# Patient Record
Sex: Female | Born: 1945
Health system: Southern US, Community
[De-identification: ages and names within clinical notes are randomized; demographics above are authoritative.]

## PROBLEM LIST (undated history)

## (undated) DIAGNOSIS — N3281 Overactive bladder: Secondary | ICD-10-CM

## (undated) DIAGNOSIS — K449 Diaphragmatic hernia without obstruction or gangrene: Secondary | ICD-10-CM

## (undated) DIAGNOSIS — I10 Essential (primary) hypertension: Secondary | ICD-10-CM

## (undated) DIAGNOSIS — Z9889 Other specified postprocedural states: Secondary | ICD-10-CM

## (undated) DIAGNOSIS — M797 Fibromyalgia: Secondary | ICD-10-CM

## (undated) DIAGNOSIS — K219 Gastro-esophageal reflux disease without esophagitis: Secondary | ICD-10-CM

## (undated) DIAGNOSIS — E7800 Pure hypercholesterolemia, unspecified: Secondary | ICD-10-CM

## (undated) DIAGNOSIS — M2550 Pain in unspecified joint: Secondary | ICD-10-CM

## (undated) DIAGNOSIS — R112 Nausea with vomiting, unspecified: Secondary | ICD-10-CM

## (undated) DIAGNOSIS — J189 Pneumonia, unspecified organism: Secondary | ICD-10-CM

## (undated) DIAGNOSIS — M1990 Unspecified osteoarthritis, unspecified site: Secondary | ICD-10-CM

## (undated) DIAGNOSIS — M549 Dorsalgia, unspecified: Secondary | ICD-10-CM

## (undated) HISTORY — DX: Unspecified osteoarthritis, unspecified site: M19.90

## (undated) HISTORY — DX: Overactive bladder: N32.81

## (undated) HISTORY — DX: Diaphragmatic hernia without obstruction or gangrene: K44.9

## (undated) HISTORY — PX: APPENDECTOMY: SHX54

## (undated) HISTORY — DX: Dorsalgia, unspecified: M54.9

## (undated) HISTORY — PX: INNER EAR SURGERY: SHX679

## (undated) HISTORY — PX: OTHER SURGICAL HISTORY: SHX169

## (undated) HISTORY — DX: Pure hypercholesterolemia, unspecified: E78.00

## (undated) HISTORY — DX: Fibromyalgia: M79.7

## (undated) HISTORY — PX: ABDOMINAL HYSTERECTOMY: SHX81

## (undated) HISTORY — DX: Pain in unspecified joint: M25.50

---

## 1998-01-09 ENCOUNTER — Other Ambulatory Visit: Admission: RE | Admit: 1998-01-09 | Discharge: 1998-01-09 | Payer: Self-pay | Admitting: *Deleted

## 1998-05-21 ENCOUNTER — Ambulatory Visit (HOSPITAL_COMMUNITY): Admission: RE | Admit: 1998-05-21 | Discharge: 1998-05-21 | Payer: Self-pay | Admitting: Internal Medicine

## 1999-04-14 ENCOUNTER — Other Ambulatory Visit: Admission: RE | Admit: 1999-04-14 | Discharge: 1999-04-14 | Payer: Self-pay | Admitting: *Deleted

## 2000-04-19 ENCOUNTER — Encounter: Payer: Self-pay | Admitting: Obstetrics and Gynecology

## 2000-04-19 ENCOUNTER — Encounter: Admission: RE | Admit: 2000-04-19 | Discharge: 2000-04-19 | Payer: Self-pay | Admitting: Obstetrics and Gynecology

## 2000-08-12 ENCOUNTER — Other Ambulatory Visit: Admission: RE | Admit: 2000-08-12 | Discharge: 2000-08-12 | Payer: Self-pay | Admitting: Obstetrics and Gynecology

## 2000-09-20 ENCOUNTER — Ambulatory Visit (HOSPITAL_COMMUNITY): Admission: RE | Admit: 2000-09-20 | Discharge: 2000-09-20 | Payer: Self-pay | Admitting: Gastroenterology

## 2000-09-20 ENCOUNTER — Encounter (INDEPENDENT_AMBULATORY_CARE_PROVIDER_SITE_OTHER): Payer: Self-pay | Admitting: *Deleted

## 2000-11-15 ENCOUNTER — Encounter: Admission: RE | Admit: 2000-11-15 | Discharge: 2000-11-15 | Payer: Self-pay | Admitting: Gastroenterology

## 2000-11-15 ENCOUNTER — Encounter: Payer: Self-pay | Admitting: Gastroenterology

## 2000-11-15 ENCOUNTER — Encounter (INDEPENDENT_AMBULATORY_CARE_PROVIDER_SITE_OTHER): Payer: Self-pay | Admitting: *Deleted

## 2001-10-07 ENCOUNTER — Encounter: Payer: Self-pay | Admitting: Obstetrics and Gynecology

## 2001-10-07 ENCOUNTER — Encounter: Admission: RE | Admit: 2001-10-07 | Discharge: 2001-10-07 | Payer: Self-pay | Admitting: Obstetrics and Gynecology

## 2002-07-27 ENCOUNTER — Encounter: Admission: RE | Admit: 2002-07-27 | Discharge: 2002-07-27 | Payer: Self-pay | Admitting: Obstetrics and Gynecology

## 2002-07-27 ENCOUNTER — Encounter: Payer: Self-pay | Admitting: Obstetrics and Gynecology

## 2002-10-20 ENCOUNTER — Encounter: Admission: RE | Admit: 2002-10-20 | Discharge: 2002-10-20 | Payer: Self-pay | Admitting: Obstetrics and Gynecology

## 2002-10-20 ENCOUNTER — Encounter: Payer: Self-pay | Admitting: Obstetrics and Gynecology

## 2003-05-10 ENCOUNTER — Encounter: Admission: RE | Admit: 2003-05-10 | Discharge: 2003-05-10 | Payer: Self-pay | Admitting: Cardiology

## 2003-05-30 ENCOUNTER — Encounter: Admission: RE | Admit: 2003-05-30 | Discharge: 2003-05-30 | Payer: Self-pay | Admitting: Cardiology

## 2004-01-28 ENCOUNTER — Ambulatory Visit (HOSPITAL_COMMUNITY): Admission: RE | Admit: 2004-01-28 | Discharge: 2004-01-28 | Payer: Self-pay | Admitting: Family Medicine

## 2004-06-13 ENCOUNTER — Encounter: Admission: RE | Admit: 2004-06-13 | Discharge: 2004-06-13 | Payer: Self-pay | Admitting: Obstetrics and Gynecology

## 2005-03-25 ENCOUNTER — Ambulatory Visit: Payer: Self-pay | Admitting: Pulmonary Disease

## 2005-05-05 ENCOUNTER — Ambulatory Visit: Payer: Self-pay | Admitting: Internal Medicine

## 2005-05-08 ENCOUNTER — Ambulatory Visit: Payer: Self-pay | Admitting: Internal Medicine

## 2005-05-15 ENCOUNTER — Ambulatory Visit: Payer: Self-pay | Admitting: Cardiology

## 2005-05-21 ENCOUNTER — Ambulatory Visit: Payer: Self-pay | Admitting: Internal Medicine

## 2005-05-26 ENCOUNTER — Ambulatory Visit (HOSPITAL_COMMUNITY): Admission: RE | Admit: 2005-05-26 | Discharge: 2005-05-26 | Payer: Self-pay | Admitting: Internal Medicine

## 2005-06-18 ENCOUNTER — Encounter: Payer: Self-pay | Admitting: Gastroenterology

## 2005-07-29 ENCOUNTER — Encounter: Admission: RE | Admit: 2005-07-29 | Discharge: 2005-07-29 | Payer: Self-pay | Admitting: Obstetrics and Gynecology

## 2005-08-24 ENCOUNTER — Ambulatory Visit: Payer: Self-pay | Admitting: Internal Medicine

## 2006-08-13 ENCOUNTER — Encounter: Admission: RE | Admit: 2006-08-13 | Discharge: 2006-08-13 | Payer: Self-pay | Admitting: Obstetrics and Gynecology

## 2007-08-19 ENCOUNTER — Encounter: Admission: RE | Admit: 2007-08-19 | Discharge: 2007-08-19 | Payer: Self-pay | Admitting: Obstetrics and Gynecology

## 2009-02-01 ENCOUNTER — Encounter: Admission: RE | Admit: 2009-02-01 | Discharge: 2009-02-01 | Payer: Self-pay | Admitting: Obstetrics and Gynecology

## 2009-04-22 ENCOUNTER — Encounter (INDEPENDENT_AMBULATORY_CARE_PROVIDER_SITE_OTHER): Payer: Self-pay | Admitting: *Deleted

## 2009-05-27 ENCOUNTER — Ambulatory Visit: Payer: Self-pay | Admitting: Gastroenterology

## 2009-05-27 ENCOUNTER — Encounter (INDEPENDENT_AMBULATORY_CARE_PROVIDER_SITE_OTHER): Payer: Self-pay | Admitting: *Deleted

## 2009-05-27 DIAGNOSIS — Z860100 Personal history of colon polyps, unspecified: Secondary | ICD-10-CM | POA: Insufficient documentation

## 2009-05-27 DIAGNOSIS — K5900 Constipation, unspecified: Secondary | ICD-10-CM | POA: Insufficient documentation

## 2009-05-28 ENCOUNTER — Telehealth: Payer: Self-pay | Admitting: Gastroenterology

## 2009-06-20 ENCOUNTER — Ambulatory Visit: Payer: Self-pay | Admitting: Gastroenterology

## 2009-06-20 ENCOUNTER — Ambulatory Visit (HOSPITAL_COMMUNITY): Admission: RE | Admit: 2009-06-20 | Discharge: 2009-06-20 | Payer: Self-pay | Admitting: Gastroenterology

## 2009-06-24 ENCOUNTER — Encounter: Payer: Self-pay | Admitting: Gastroenterology

## 2010-01-07 ENCOUNTER — Telehealth (INDEPENDENT_AMBULATORY_CARE_PROVIDER_SITE_OTHER): Payer: Self-pay | Admitting: *Deleted

## 2010-03-28 ENCOUNTER — Encounter
Admission: RE | Admit: 2010-03-28 | Discharge: 2010-03-28 | Payer: Self-pay | Source: Home / Self Care | Attending: Obstetrics and Gynecology | Admitting: Obstetrics and Gynecology

## 2010-04-08 NOTE — Assessment & Plan Note (Signed)
Summary: consultation--ch.   History of Present Illness Visit Type: Initial Visit Primary GI MD: Sheryn Bison MD FACP FAGA Primary Provider: Elias Else, MD Chief Complaint: Consultation : Polyp removed in prevoius colon precancerous History of Present Illness:   65 year old Caucasian female self referred for colonoscopy followup for family history of colon cancer father at age 67 and 2 brothers with associated colonic polyposis. Patient began colonoscopy screening at age 65 and apparently has had recurrent colon polyps. Her last exam was 5 years ago by Dr. Sabino Gasser but this report is not available for review. She had attempted colonoscopy in 2002 by Dr. Carman Ching, and this was unsuccessful because of a very redundant colon and she subsequently had a negative barium enema exam. She denies any gastrointestinal problems at this time so for some mild constipation. She follows a regular diet and denies any food intolerances. Her medical history is otherwise unremarkable she's had a previous hysterectomy and appendectomy.  She suctioned mild central hypertension and fibromyalgia. She is on daily Cymbalta 20 mg, Benicar, and Premarin. She has history of penicillin and sulfur allergy.   GI Review of Systems    Reports acid reflux and  weight gain.      Denies abdominal pain, belching, bloating, chest pain, dysphagia with liquids, dysphagia with solids, heartburn, loss of appetite, nausea, vomiting, vomiting blood, and  weight loss.      Reports constipation.     Denies anal fissure, black tarry stools, change in bowel habit, diarrhea, diverticulosis, fecal incontinence, heme positive stool, hemorrhoids, irritable bowel syndrome, jaundice, light color stool, liver problems, rectal bleeding, and  rectal pain. Preventive Screening-Counseling & Management  Alcohol-Tobacco     Smoking Status: never      Drug Use:  no.      Current Medications (verified): 1)  Premarin 0.625 Mg Tabs  (Estrogens Conjugated) .... Once Daily 2)  Benicar Hct 20-12.5 Mg Tabs (Olmesartan Medoxomil-Hctz) .... Take 1 Tablet By Mouth Once Daily 3)  Cymbalta 20 Mg Cpep (Duloxetine Hcl) .... Once Daily 4)  Toviaz 8 Mg Xr24h-Tab (Fesoterodine Fumarate) .... Once Daily  Allergies (verified): 1)  ! Sulfa 2)  ! Penicillin  Past History:  Past medical, surgical, family and social histories (including risk factors) reviewed for relevance to current acute and chronic problems.  Past Medical History: GERD Hyperlipidemia Adenomatous Colon Polyps Fibromyalgia Hypertension Pneumonia  Past Surgical History: Appendectomy Hysterectomy Bladder Repair  Family History: Reviewed history from 05/22/2009 and no changes required. Family History of Heart Disease: Father Family History of Breast Cancer:Aunt Family History of Colon Cancer:Father Family History of Colon Polyps Parents &Sibling:  Social History: Reviewed history from 05/22/2009 and no changes required. Patient has never smoked.  Occupation: Aeronautical engineer Alcohol Use - no Daily Caffeine Use Illicit Drug Use - no Drug Use:  no  Vital Signs:  Patient profile:   65 year old female Height:      62 inches Weight:      157.50 pounds BMI:     28.91 Pulse rate:   68 / minute Pulse rhythm:   regular BP sitting:   122 / 72  (left arm) Cuff size:   regular  Vitals Entered By: June McMurray CMA Duncan Dull) (May 27, 2009 1:43 PM)  Physical Exam  General:  Well developed, well nourished, no acute distress.healthy appearing.   Head:  Normocephalic and atraumatic. Eyes:  PERRLA, no icterus.exam deferred to patient's ophthalmologist.   Neck:  Supple; no masses or thyromegaly. Lungs:  Clear  throughout to auscultation. Heart:  Regular rate and rhythm; no murmurs, rubs,  or bruits. Abdomen:  Soft, nontender and nondistended. No masses, hepatosplenomegaly or hernias noted. Normal bowel sounds. Rectal:  deferred until time of colonoscopy.    Msk:  Symmetrical with no gross deformities. Normal posture. Pulses:  Normal pulses noted. Extremities:  No clubbing, cyanosis, edema or deformities noted. Neurologic:  Alert and  oriented x4;  grossly normal neurologically. Skin:  Intact without significant lesions or rashes. Cervical Nodes:  No significant cervical adenopathy. Inguinal Nodes:  No significant inguinal adenopathy. Psych:  Alert and cooperative. Normal mood and affect.   Impression & Recommendations:  Problem # 1:  PERSONAL HX COLONIC POLYPS (ICD-V12.72) Assessment Unchanged Colonoscopy scheduled propofol general anesthesia and nurse anesthetist assistance for previous difficult colonoscopy related to sigmoid tortuosity. We discussed colonoscopy and polypectomy in detail with alternative methods of colon examination and she agreed to proceed as planned. I have referred her to our geneticist for possible screening for colonic polyposis syndromes per her family history.  Problem # 2:  CONSTIPATION (ICD-564.00) Assessment: Improved High fiber diet as tolerated liberal p.o. fluids. She personally feels her constipation is related to Cymbalta use. She otherwise in good health and has no history of chronic thyroid dysfunction or other metabolic abnormality  Patient Instructions: 1)  You have been scheduled for a colonoscopy. 2)  You will be referred for genetic counciling.   3)  The medication list was reviewed and reconciled.  All changed / newly prescribed medications were explained.  A complete medication list was provided to the patient / caregiver. 4)  Copy sent to : Dr. Elias Else 5)  Constipation and Hemorrhoids brochure given.  6)  Colonoscopy and Flexible Sigmoidoscopy brochure given.  7)  Conscious Sedation brochure given.  8)  Please continue current medications.  9)     Appended Document: consultation--ch.    Clinical Lists Changes  Medications: Added new medication of MOVIPREP 100 GM  SOLR  (PEG-KCL-NACL-NASULF-NA ASC-C) As per prep instructions. - Signed Rx of MOVIPREP 100 GM  SOLR (PEG-KCL-NACL-NASULF-NA ASC-C) As per prep instructions.;  #1 x 0;  Signed;  Entered by: Ashok Cordia RN;  Authorized by: Mardella Layman MD [facility];  Method used: Printed then faxed to [facility], [address] [place], [address], [place], [place]  [address], Ph: [phone], Fax: [phone] Orders: Added new Test order of ZCOL (ZCOL) - Signed    Prescriptions: MOVIPREP 100 GM  SOLR (PEG-KCL-NACL-NASULF-NA ASC-C) As per prep instructions.  #1 x 0   Entered by:   Ashok Cordia RN   Authorized by:   Mardella Layman MD [facility]   Signed by:   Ashok Cordia RN on 05/27/2009   Method used:   Printed then faxed to ...       [facility] (retail)       [address] [place]       [address]       [place], [place]  [address]       Ph: [phone]       Fax: [phone]   RxID:   [id number]

## 2010-04-08 NOTE — Procedures (Signed)
Summary: Instructions for procedure/[facility] (out pt)  Instructions for procedure/[facility] (out pt)   Imported By: Sherian Rein 06/03/2009 07:57:14  _____________________________________________________________________  External Attachment:    Type:   Image     Comment:   External Document

## 2010-04-08 NOTE — Progress Notes (Signed)
Summary: CHANGE IN GI  ---- Converted from flag ---- ---- 01/06/2010 2:45 PM, Wilder Glade wrote: Hulan Saas, This patient is requesting for her records to be sent to Dr. Kenna Gilbert office. ------------------------------

## 2010-04-08 NOTE — Procedures (Signed)
Summary: Upper Endoscopy/[facility]  Upper Endoscopy/[facility]   Imported By: Sherian Rein 07/05/2009 09:43:07  _____________________________________________________________________  External Attachment:    Type:   Image     Comment:   External Document

## 2010-04-08 NOTE — Procedures (Signed)
Summary: Colonoscopy  Patient: Catherine Arias Note: All result statuses are Final unless otherwise noted.  Tests: (1) Colonoscopy (COL)   COL Colonoscopy           DONE     [facility]     [address] [place], [place]  [address]           COLONOSCOPY PROCEDURE REPORT           PATIENT:  [name], [name]  MR#:  [id number]     BIRTHDATE:  [birth year], 64 yrs. old  GENDER:  female     ENDOSCOPIST:  Rachael Fee, MD     REF. BY:  Elias Else, M.D.     PROCEDURE DATE:  06/20/2009     PROCEDURE:  Colonoscopy with snare polypectomy     ASA CLASS:  Class II     INDICATIONS:  history of pre-cancerous (adenomatous) colon polyps,     father had colon cancer     MEDICATIONS:   MAC sedation, administered by CRNA     DESCRIPTION OF PROCEDURE:   After the risks benefits and     alternatives of the procedure were thoroughly explained, informed     consent was obtained.  No rectal exam performed. The EC-[id number]     ([id number]) endoscope was introduced through the anus and advanced     to the cecum, which was identified by both the appendix and     ileocecal valve, without limitations.  The quality of the prep was     good, using MoviPrep.  The instrument was then slowly withdrawn as     the colon was fully examined.           <<PROCEDUREIMAGES>>     FINDINGS:  Three small sessile polyps were found, all were removed     with cold snare, all were retrieved and sent to pathology (jar 1).     The polyps ranged in size from 3mm to 4mm, located in cecum,     ascending colon (see image003 and image004).  This was otherwise a     normal examination of the colon (see image001, image002, and     image005).  Unable to retroflex due to small rectal vault, however     very good views of distal rectum, anus were obtained without     retroflex.  The scope was then withdrawn from the patient and the     procedure completed.     COMPLICATIONS:  None           ENDOSCOPIC IMPRESSION:     1)  Three subcentimeter polyps, all removed and sent to pathology           2) Otherwise normal examination           RECOMMENDATIONS:     1) If the polyp(s) removed today are proven to be adenomatous     (pre-cancerous) polyps, you will need a colonoscopy in 3-5 years.           2) You will receive a letter within 1-2 weeks with the results     of your biopsy as well as final recommendations. Please call my     office if you have not received a letter after 3 weeks.           _____________________________     Rachael Fee, MD           n.  eSIGNED:   Rachael Fee at 06/20/2009 09:06 AM           Rosario Jacks, [phone]  Note: An exclamation mark (!) indicates a result that was not dispersed into the flowsheet. Document Creation Date: 06/20/2009 9:06 AM _______________________________________________________________________  (1) Order result status: Final Collection or observation date-time: 06/20/2009 09:00 Requested date-time:  Receipt date-time:  Reported date-time:  Referring Physician:   Ordering Physician: Rob Bunting [phone]) Specimen Source:  Source: Launa Grill Order Number: [id number] Lab site:

## 2010-04-08 NOTE — Procedures (Signed)
Summary: Colonoscopy/[facility]  Colonoscopy/[facility]   Imported By: Sherian Rein 07/05/2009 09:44:19  _____________________________________________________________________  External Attachment:    Type:   Image     Comment:   External Document

## 2010-04-08 NOTE — Letter (Signed)
Summary: Results Letter  [facility]  [address] [place], [place] [address]   Phone: [phone]  Fax: [phone]        June 24, 2009 MRN: [id number]    [facility] [place] [address] [place], [place]  [address]    Dear Ms. Havrilla,   The polyp(s) removed during your recent procedure were proven to be adenomatous.  These are pre-cancerous polyps that may have grown into cancers if they had not been removed.  Based on current nationally recognized surveillance guidelines, I recommend that you have a repeat colonoscopy in 3 years.   We will therefore put your information in our reminder system and will contact you in 3 years to schedule a repeat procedure.  Please call if you have any questions or concerns.       Sincerely,  Rachael Fee MD  This letter has been electronically signed by your physician.  Appended Document: Results Letter letter mailed

## 2010-04-08 NOTE — Procedures (Signed)
Summary: Colon   Colonoscopy  Procedure date:  09/20/2000  Findings:      Location:  [facility]                          [facility]  Patient:    Catherine Arias, Catherine Arias                      MRN: [id number] Proc. Date: 09/20/00 Adm. Date:  [id number] Attending:  Orland Mustard CC:         Elana Alm. Eliezer Lofts., M.D.   Procedure Report  PROCEDURE:  Attempted colonoscopy.  SURGEON:  James L. Edwards, M.D.  MEDICATIONS:  Fentanyl 100 mcg and Versed 10 mg IV.  SCOPE:  Adult scope switched to pediatric scope.  INDICATIONS:  Rectal bleeding with a strong family history of colon cancer, father and grandfather.  The patient apparently had a colonoscopy attempted in [place] prior to moving to [place] a number of years ago, and they were unable to complete the colonoscopy.  DESCRIPTION OF PROCEDURE:  The procedure had been explained to the patient and consent obtained.  With the patient in the left lateral decubitus position, the Olympus adult video colonoscope was inserted and advanced under direct visualization.  The prep was quite good.  We were able to advance to what was felt to be the descending colon and splenic flexure area.  We were unable to pass through an area that simply would not open up.  My feeling was that this was probably the transverse colon.  We subsequently removed the adult scope and switched to one of the newer adult scopes, and went with the pediatric video colonoscope.  We advanced this to the same area and we were unable to advance further despite multiple maneuvers.  At this point, I did not feel it was safe to continue.  It was obvious that there were adhesions or some other abnormality making it difficult to pass the scope.  I went ahead and withdrew the scope.  The mucosa was examined and no abnormalities were seen.  The rectum did reveal some internal hemorrhoids in the rectum.  The patient was monitored on low flow oxygen and  pulse oximeter throughout the procedure with no obvious problems.  ASSESSMENT:  Incomplete colonoscopy, probably due to intra-abdominal adhesions causing problems.  PLAN:  Will see the patient back in the office in six weeks.  Will probably arrange a barium enema.  I think in the future, a sigmoid barium enema would probably be the best way to go. DD:  09/20/00 TD:  09/20/00 Job: [id number]     This report was created from the original endoscopy report, which was reviewed and signed by the above listed endoscopist.

## 2010-04-08 NOTE — Progress Notes (Signed)
Summary: Needs colonoscopy/path report  Phone Note Other Incoming   Caller: [facility]  [phone] Summary of Call: Needs pt. 's colonoscopy and path report from her procedure  Initial call taken by: Karna Christmas,  May 28, 2009 1:00 PM  Follow-up for Phone Call        Pt not sch to have colon until 06/20/09.  Will send report when available.  Left message for [facility]. Follow-up by: Ashok Cordia RN,  May 28, 2009 4:06 PM

## 2010-04-08 NOTE — Letter (Signed)
Summary: New Patient letter  [facility]  [address] [place], [place] [address]   Phone: [phone]  Fax: [phone]       04/22/2009 MRN: [id number]  [facility] [address] [place], [place]  [address]  Dear Ms. Billy,  Welcome to the Gastroenterology Division at [facility].    You are scheduled to see Dr.  Jarold Motto on 05-27-09 at 1:30p.m. on the 3rd floor at [facility], [address]  We ask that you try to arrive at our office 15 minutes prior to your appointment time to allow for check-in.  We would like you to complete the enclosed self-administered evaluation form prior to your visit and bring it with you on the day of your appointment.  We will review it with you.  Also, please bring a complete list of all your medications or, if you prefer, bring the medication bottles and we will list them.  Please bring your insurance card so that we may make a copy of it.  If your insurance requires a referral to see a specialist, please bring your referral form from your primary care physician.  Co-payments are due at the time of your visit and may be paid by cash, check or credit card.     Your office visit will consist of a consult with your physician (includes a physical exam), any laboratory testing he/she may order, scheduling of any necessary diagnostic testing (e.g. x-ray, ultrasound, CT-scan), and scheduling of a procedure (e.g. Endoscopy, Colonoscopy) if required.  Please allow enough time on your schedule to allow for any/all of these possibilities.    If you cannot keep your appointment, please call [phone] to cancel or reschedule prior to your appointment date.  This allows [place] the opportunity to schedule an appointment for another patient in need of care.  If you do not cancel or reschedule by 5 p.m. the business day prior to your appointment date, you will be charged a $50.00 late cancellation/no-show fee.    Thank you for  choosing [facility] for your medical needs.  We appreciate the opportunity to care for you.  Please visit [place] at our website  to learn more about our practice.                     Sincerely,                                                             The Gastroenterology Division

## 2010-04-08 NOTE — Letter (Signed)
Summary: Catherine Arias  [facility]  [address] [place], [place] [address]   Phone: [phone]  Fax: [phone]       Catherine Arias    [birth year]    MRN: [id number]        Procedure Day /Date: Thursday, 06/20/09     Arrival Time: 7:00      Procedure Time: 8:30     Location of Procedure:                     Catherine Arias  [facility] ( Outpatient Registration)                        PREPARATION FOR COLONOSCOPY WITH MOVIPREP   Starting 5 days prior to your procedure 06/14/09 do not eat nuts, seeds, popcorn, corn, beans, peas,  salads, or any raw vegetables.  Do not take any fiber supplements (e.g. Metamucil, Citrucel, and Benefiber).  THE DAY BEFORE YOUR PROCEDURE         DATE: 06/19/09   DAY: Wednesday  1.  Drink clear liquids the entire day-NO SOLID FOOD  2.  Do not drink anything colored red or purple.  Avoid juices with pulp.  No orange juice.  3.  Drink at least 64 oz. (8 glasses) of fluid/clear liquids during the day to prevent dehydration and help the prep work efficiently.  CLEAR LIQUIDS INCLUDE: Water Jello Ice Popsicles Tea (sugar ok, no milk/cream) Powdered fruit flavored drinks Coffee (sugar ok, no milk/cream) Gatorade Juice: apple, white grape, white cranberry  Lemonade Clear bullion, consomm, broth Carbonated beverages (any kind) Strained chicken noodle soup Hard Candy                             4.  In the morning, mix first dose of MoviPrep solution:    Empty 1 Pouch A and 1 Pouch B into the disposable container    Add lukewarm drinking water to the top line of the container. Mix to dissolve    Refrigerate (mixed solution should be used within 24 hrs)  5.  Begin drinking the prep at 5:00 p.m. The MoviPrep container is divided by 4 marks.   Every 15 minutes drink the solution down to the next mark (approximately 8 oz) until the full liter is complete.   6.  Follow completed prep with 16 oz of clear liquid of your  choice (Nothing red or purple).  Continue to drink clear liquids until bedtime.  7.  Before going to bed, mix second dose of MoviPrep solution:    Empty 1 Pouch A and 1 Pouch B into the disposable container    Add lukewarm drinking water to the top line of the container. Mix to dissolve    Refrigerate  THE DAY OF YOUR PROCEDURE      DATE: 06/20/09  DAY: Thursday  Beginning at 3:30 a.m. (5 hours before procedure):         1. Every 15 minutes, drink the solution down to the next mark (approx 8 oz) until the full liter is complete.  2. Follow completed prep with 16 oz. of clear liquid of your choice.    3. Do not eat or drink anything except after taking your prep   MEDICATION Arias  Unless otherwise instructed, you should take regular prescription medications with a small sip of water   as early as possible the  morning of your procedure.                   OTHER Arias  You will need a responsible adult at least 67 years of age to accompany you and drive you home.   This person must remain in the waiting room during your procedure.  Wear loose fitting clothing that is easily removed.  Leave jewelry and other valuables at home.  However, you may wish to bring a book to read or  an iPod/MP3 player to listen to music as you wait for your procedure to start.  Remove all body piercing jewelry and leave at home.  Total time from sign-in until discharge is approximately 2-3 hours.  You should go home directly after your procedure and rest.  You can resume normal activities the  day after your procedure.  The day of your procedure you should not:   Drive   Make legal decisions   Operate machinery   Drink alcohol   Return to work  You will receive specific Arias about eating, activities and medications before you leave.    The above Arias have been reviewed and explained to me by   _______________________    I fully understand  and can verbalize these Arias _____________________________ Date _________

## 2010-07-25 NOTE — Procedures (Signed)
[facility]  Patient:    Catherine Arias, Catherine Arias                      MRN: [id number] Proc. Date: 09/20/00 Adm. Date:  [id number] Attending:  Orland Mustard CC:         Elana Alm. Eliezer Lofts., M.D.   Procedure Report  PROCEDURE:  Attempted colonoscopy.  SURGEON:  James L. Edwards, M.D.  MEDICATIONS:  Fentanyl 100 mcg and Versed 10 mg IV.  SCOPE:  Adult scope switched to pediatric scope.  INDICATIONS:  Rectal bleeding with a strong family history of colon cancer, father and grandfather.  The patient apparently had a colonoscopy attempted in [place] prior to moving to [place] a number of years ago, and they were unable to complete the colonoscopy.  DESCRIPTION OF PROCEDURE:  The procedure had been explained to the patient and consent obtained.  With the patient in the left lateral decubitus position, the Olympus adult video colonoscope was inserted and advanced under direct visualization.  The prep was quite good.  We were able to advance to what was felt to be the descending colon and splenic flexure area.  We were unable to pass through an area that simply would not open up.  My feeling was that this was probably the transverse colon.  We subsequently removed the adult scope and switched to one of the newer adult scopes, and went with the pediatric video colonoscope.  We advanced this to the same area and we were unable to advance further despite multiple maneuvers.  At this point, I did not feel it was safe to continue.  It was obvious that there were adhesions or some other abnormality making it difficult to pass the scope.  I went ahead and withdrew the scope.  The mucosa was examined and no abnormalities were seen.  The rectum did reveal some internal hemorrhoids in the rectum.  The patient was monitored on low flow oxygen and pulse oximeter throughout the procedure with no obvious problems.  ASSESSMENT:  Incomplete colonoscopy, probably  due to intra-abdominal adhesions causing problems.  PLAN:  Will see the patient back in the office in six weeks.  Will probably arrange a barium enema.  I think in the future, a sigmoid barium enema would probably be the best way to go. DD:  09/20/00 TD:  09/20/00 Job: [id number]

## 2011-05-06 ENCOUNTER — Other Ambulatory Visit: Payer: Self-pay | Admitting: Gastroenterology

## 2012-02-22 ENCOUNTER — Other Ambulatory Visit: Payer: Self-pay | Admitting: Family Medicine

## 2012-02-22 DIAGNOSIS — Z1231 Encounter for screening mammogram for malignant neoplasm of breast: Secondary | ICD-10-CM

## 2012-02-23 ENCOUNTER — Other Ambulatory Visit: Payer: Self-pay | Admitting: Family Medicine

## 2012-02-23 DIAGNOSIS — Z780 Asymptomatic menopausal state: Secondary | ICD-10-CM

## 2012-03-30 ENCOUNTER — Other Ambulatory Visit: Payer: Self-pay

## 2012-03-30 ENCOUNTER — Ambulatory Visit: Payer: Self-pay

## 2012-06-15 ENCOUNTER — Ambulatory Visit
Admission: RE | Admit: 2012-06-15 | Discharge: 2012-06-15 | Disposition: A | Discharge status: Home / Self Care | Payer: Medicare Other | Source: Ambulatory Visit | Attending: Family Medicine | Admitting: Family Medicine

## 2012-06-15 DIAGNOSIS — Z1231 Encounter for screening mammogram for malignant neoplasm of breast: Secondary | ICD-10-CM

## 2012-06-15 DIAGNOSIS — Z780 Asymptomatic menopausal state: Secondary | ICD-10-CM

## 2013-07-07 ENCOUNTER — Other Ambulatory Visit: Payer: Self-pay

## 2013-07-07 DIAGNOSIS — Z1231 Encounter for screening mammogram for malignant neoplasm of breast: Secondary | ICD-10-CM

## 2013-07-20 ENCOUNTER — Ambulatory Visit: Payer: Medicare Other

## 2013-08-03 ENCOUNTER — Ambulatory Visit: Payer: Medicare Other

## 2013-09-18 ENCOUNTER — Ambulatory Visit
Admission: RE | Admit: 2013-09-18 | Discharge: 2013-09-18 | Disposition: A | Discharge status: Home / Self Care | Payer: Medicare Other | Source: Ambulatory Visit

## 2013-09-18 DIAGNOSIS — Z1231 Encounter for screening mammogram for malignant neoplasm of breast: Secondary | ICD-10-CM

## 2014-08-30 ENCOUNTER — Other Ambulatory Visit: Payer: Self-pay

## 2014-08-30 DIAGNOSIS — Z1231 Encounter for screening mammogram for malignant neoplasm of breast: Secondary | ICD-10-CM

## 2014-09-25 ENCOUNTER — Ambulatory Visit (INDEPENDENT_AMBULATORY_CARE_PROVIDER_SITE_OTHER): Payer: Medicare Other | Admitting: Internal Medicine

## 2014-09-25 VITALS — BP 122/64 | HR 60 | Temp 97.9°F | Resp 16 | Ht 62.0 in | Wt 151.0 lb

## 2014-09-25 DIAGNOSIS — L259 Unspecified contact dermatitis, unspecified cause: Secondary | ICD-10-CM

## 2014-09-25 DIAGNOSIS — I10 Essential (primary) hypertension: Secondary | ICD-10-CM

## 2014-09-25 MED ORDER — PREDNISONE 20 MG PO TABS: ORAL_TABLET | ORAL | Status: DC

## 2014-09-25 NOTE — Progress Notes (Signed)
   Subjective:  This chart was scribed for Catherine Siaobert Hannah Strader, MD by Stann Oresung-Kai Tsai, Medical Scribe. This patient was seen in Room 11 and the patient's care was started at 10:28 AM.     Patient ID: Catherine Arias, female    DOB: 02/13/1946, 69 y.o.   MRN: [id number]  HPI Catherine OharaSandra E Arias is a 69 y.o. female who [facility] complaining of a rash on her neck and her right side of the face that was noticed this morning. She also has some on her arms. She was pulling things up in her yard and may have been in contact with poison ivy. She denies any itchiness in the eye.   She is currently on vesicare and benicar.    Patient Active Problem List   Diagnosis Date Noted  . CONSTIPATION 05/27/2009  . PERSONAL HX COLONIC POLYPS 05/27/2009    Current outpatient prescriptions:  .  olmesartan (BENICAR) 20 MG tablet, Take 20 mg by mouth daily., Disp: , Rfl:     Review of Systems  Constitutional: Negative for fever and fatigue.  HENT: Negative for congestion, rhinorrhea, sneezing and sore throat.   Eyes: Negative for itching.  Gastrointestinal: Negative for nausea, vomiting, diarrhea and constipation.  Skin: Positive for rash (right face, neck, arms). Negative for wound.  Neurological: Negative for dizziness and headaches.       Objective:   Physical Exam  Constitutional: She is oriented to person, place, and time. She appears well-developed and well-nourished. No distress.  HENT:  Head: Normocephalic and atraumatic.  Eyes: EOM are normal. Pupils are equal, round, and reactive to light.  Neck: Neck supple.  Cardiovascular: Normal rate.   Pulmonary/Chest: Effort normal. No respiratory distress.  Musculoskeletal: Normal range of motion.  Neurological: She is alert and oriented to person, place, and time.  Skin: Skin is warm and dry.  Red papules and vesicles over R orbit, face , neck and more scattered lesions both lower arms  Psychiatric: She has a normal mood and affect. Her behavior  is normal.  Nursing note and vitals reviewed.   BP 122/64 mmHg  Pulse 60  Temp(Src) 97.9 F (36.6 C) (Oral)  Resp 16  Ht 5\' 2"  (1.575 m)  Wt 151 lb (68.493 kg)  BMI 27.61 kg/m2  SpO2 97%       Assessment & Plan:  Contact dermatitis  Essential hypertension  Meds ordered this encounter  Medications  . predniSONE (DELTASONE) 20 MG tablet    Sig: 3/33/05/08/20/2/1/1/1/1 single daily dose for 12 days    Dispense:  24 tablet    Refill:  0    I have completed the patient encounter in its entirety as documented by the scribe, with editing by me where necessary. Clement Deneault P. Merla Richesoolittle, M.D.

## 2014-09-28 ENCOUNTER — Ambulatory Visit
Admission: RE | Admit: 2014-09-28 | Discharge: 2014-09-28 | Disposition: A | Discharge status: Home / Self Care | Payer: Medicare Other | Source: Ambulatory Visit

## 2014-09-28 DIAGNOSIS — Z1231 Encounter for screening mammogram for malignant neoplasm of breast: Secondary | ICD-10-CM

## 2014-10-16 ENCOUNTER — Ambulatory Visit (INDEPENDENT_AMBULATORY_CARE_PROVIDER_SITE_OTHER): Payer: Medicare Other | Admitting: Family Medicine

## 2014-10-16 VITALS — BP 112/74 | HR 74 | Temp 98.0°F | Resp 14 | Ht 62.0 in | Wt 153.0 lb

## 2014-10-16 DIAGNOSIS — L259 Unspecified contact dermatitis, unspecified cause: Secondary | ICD-10-CM | POA: Diagnosis not present

## 2014-10-16 DIAGNOSIS — L739 Follicular disorder, unspecified: Secondary | ICD-10-CM | POA: Diagnosis not present

## 2014-10-16 DIAGNOSIS — H1013 Acute atopic conjunctivitis, bilateral: Secondary | ICD-10-CM

## 2014-10-16 MED ORDER — OLOPATADINE HCL 0.1 % OP SOLN: 1.0000 [drp] | Freq: Two times a day (BID) | OPHTHALMIC | Status: DC

## 2014-10-16 MED ORDER — TRIAMCINOLONE ACETONIDE 0.1 % EX CREA: 1.0000 "application " | TOPICAL_CREAM | Freq: Two times a day (BID) | CUTANEOUS | Status: DC

## 2014-10-16 MED ORDER — MUPIROCIN 2 % EX OINT: 1.0000 "application " | TOPICAL_OINTMENT | Freq: Three times a day (TID) | CUTANEOUS | Status: DC

## 2014-10-16 NOTE — Progress Notes (Signed)
  Subjective:  Patient ID: Catherine Arias, female    DOB: [birth year]  Age: 69 y.o. MRN: [id number]  Patient was treated for poison ivy 3 weeks ago. She took a long taper of prednisone to finish that of a week ago. She has started having some itching around her eyes and in her eyes. She had a little mattering in her eyes this morning. She has a small area of rash on her left third finger. She has some little red spots on her ankles. She works in her garden. She is worried that the poison ivy is coming back.   Objective:   No classic contact dermatitis and linear streaking patterns were noted. Her eyes not terribly injected. Facial skin looks okay. She itches on her neck but no rashes apparent. She has the a little pustule on her third finger. This was opened and cultured. She has a couple of little red bumps on her legs.  Assessment & Plan:   Assessment:  Allergic dermatitis, nonspecific Allergic conjunctivitis Follicular infection on finger  Plan:  Will treat as several different things. I do not think it is poison ivy. She is to return if worse. We will try and let her know the results of the culture finger.  Patient Instructions  Use the allergy eyedrops olopatadine 1 drop each eye twice daily  Apply the topical mupirocin ointment on the little infected placed on the finger twice daily. Keep it clean and covered with a Band-Aid.  Use the triamcinolone cream on other areas of poison ivy itching of the skin.  Return if not improving  We will let you know the results of the wound culture if it grows any staph or anything    HOPPER,DAVID, MD 10/16/2014

## 2014-10-16 NOTE — Patient Instructions (Addendum)
Use the allergy eyedrops olopatadine 1 drop each eye twice daily  Apply the topical mupirocin ointment on the little infected placed on the finger twice daily. Keep it clean and covered with a Band-Aid.  Use the triamcinolone cream on other areas of poison ivy itching of the skin.  Return if not improving  We will let you know the results of the wound culture if it grows any staph or anything

## 2014-10-18 LAB — WOUND CULTURE
Gram Stain: NONE SEEN
Gram Stain: NONE SEEN
Organism ID, Bacteria: NO GROWTH

## 2014-10-28 ENCOUNTER — Encounter: Payer: Self-pay | Admitting: Family Medicine

## 2014-11-28 ENCOUNTER — Other Ambulatory Visit: Payer: Self-pay | Admitting: Urology

## 2015-01-03 ENCOUNTER — Encounter (HOSPITAL_BASED_OUTPATIENT_CLINIC_OR_DEPARTMENT_OTHER): Admission: RE | Payer: Self-pay | Source: Ambulatory Visit

## 2015-01-03 ENCOUNTER — Ambulatory Visit (HOSPITAL_BASED_OUTPATIENT_CLINIC_OR_DEPARTMENT_OTHER): Admission: RE | Admit: 2015-01-03 | Payer: Medicare Other | Source: Ambulatory Visit | Admitting: Urology

## 2015-01-03 SURGERY — CYSTOSCOPY
Anesthesia: Monitor Anesthesia Care

## 2015-03-19 ENCOUNTER — Other Ambulatory Visit: Payer: Self-pay | Admitting: Family Medicine

## 2015-03-19 DIAGNOSIS — M8580 Other specified disorders of bone density and structure, unspecified site: Secondary | ICD-10-CM

## 2015-04-16 ENCOUNTER — Ambulatory Visit
Admission: RE | Admit: 2015-04-16 | Discharge: 2015-04-16 | Disposition: A | Discharge status: Home / Self Care | Payer: Medicare Other | Source: Ambulatory Visit | Attending: Family Medicine | Admitting: Family Medicine

## 2015-04-16 DIAGNOSIS — M8580 Other specified disorders of bone density and structure, unspecified site: Secondary | ICD-10-CM

## 2015-10-11 ENCOUNTER — Other Ambulatory Visit: Payer: Self-pay | Admitting: Family Medicine

## 2015-10-11 DIAGNOSIS — Z1231 Encounter for screening mammogram for malignant neoplasm of breast: Secondary | ICD-10-CM

## 2015-10-18 ENCOUNTER — Ambulatory Visit
Admission: RE | Admit: 2015-10-18 | Discharge: 2015-10-18 | Disposition: A | Discharge status: Home / Self Care | Payer: Medicare Other | Source: Ambulatory Visit | Attending: Family Medicine | Admitting: Family Medicine

## 2015-10-18 DIAGNOSIS — Z1231 Encounter for screening mammogram for malignant neoplasm of breast: Secondary | ICD-10-CM

## 2015-11-29 ENCOUNTER — Ambulatory Visit (INDEPENDENT_AMBULATORY_CARE_PROVIDER_SITE_OTHER): Payer: Medicare Other | Admitting: Physician Assistant

## 2015-11-29 VITALS — BP 114/80 | HR 88 | Temp 98.2°F | Resp 18 | Ht 62.0 in | Wt 146.0 lb

## 2015-11-29 DIAGNOSIS — R300 Dysuria: Secondary | ICD-10-CM

## 2015-11-29 DIAGNOSIS — R1010 Upper abdominal pain, unspecified: Secondary | ICD-10-CM | POA: Diagnosis not present

## 2015-11-29 DIAGNOSIS — R109 Unspecified abdominal pain: Secondary | ICD-10-CM

## 2015-11-29 DIAGNOSIS — N3001 Acute cystitis with hematuria: Secondary | ICD-10-CM | POA: Diagnosis not present

## 2015-11-29 LAB — POCT CBC
Granulocyte percent: 70.6 %G (ref 37–80)
HCT, POC: 37.9 % (ref 37.7–47.9)
Hemoglobin: 13.2 g/dL (ref 12.2–16.2)
Lymph, poc: 2.1 (ref 0.6–3.4)
MCH, POC: 29.5 pg (ref 27–31.2)
MCHC: 34.8 g/dL (ref 31.8–35.4)
MCV: 85 fL (ref 80–97)
MID (cbc): 0.4 (ref 0–0.9)
MPV: 7.8 fL (ref 0–99.8)
POC Granulocyte: 6.1 (ref 2–6.9)
POC LYMPH PERCENT: 24.4 %L (ref 10–50)
POC MID %: 5 %M (ref 0–12)
Platelet Count, POC: 255 10*3/uL (ref 142–424)
RBC: 4.47 M/uL (ref 4.04–5.48)
RDW, POC: 13.8 %
WBC: 8.6 10*3/uL (ref 4.6–10.2)

## 2015-11-29 LAB — POCT URINALYSIS DIP (MANUAL ENTRY)
Bilirubin, UA: NEGATIVE
Glucose, UA: 100 — AB
Nitrite, UA: POSITIVE — AB
Protein Ur, POC: 100 — AB
Spec Grav, UA: 1.01
Urobilinogen, UA: 2
pH, UA: 7

## 2015-11-29 LAB — POC MICROSCOPIC URINALYSIS (UMFC): Mucus: ABSENT

## 2015-11-29 MED ORDER — CIPROFLOXACIN HCL 500 MG PO TABS: 500.0000 mg | ORAL_TABLET | Freq: Two times a day (BID) | ORAL | 0 refills | Status: DC

## 2015-11-29 NOTE — Patient Instructions (Signed)
     IF you received an x-ray today, you will receive an invoice from [facility]. Please contact [facility] at [phone] with questions or concerns regarding your invoice.   IF you received labwork today, you will receive an invoice from [facility]. Please contact [facility] at [phone] with questions or concerns regarding your invoice.   Our billing staff will not be able to assist you with questions regarding bills from these companies.  You will be contacted with the lab results as soon as they are available. The fastest way to get your results is to activate your My Chart account. Instructions are located on the last page of this paperwork. If you have not heard from us regarding the results in 2 weeks, please contact this office.      

## 2015-11-29 NOTE — Progress Notes (Signed)
11/29/2015 4:03 PM   DOB: 05/25/1945 / MRN: [id number]  SUBJECTIVE:  Catherine Arias is a 70 y.o. female presenting from burning with urination, low back pain. She associates urgency and frequency.  She denies hematuria. She has recently had to stop Toviaz and had to start Vesicare due to an insurance problem.   She is allergic to penicillins and sulfonamide derivatives.   She  has a past medical history of Arthritis.    She  reports that she has never smoked. She does not have any smokeless tobacco history on file. She reports that she does not drink alcohol or use drugs. She  has no sexual activity history on file. The patient  has a past surgical history that includes Cholecystectomy and Appendectomy.  Her family history is not on file.  Review of Systems  Constitutional: Negative for chills and fever.  Respiratory: Negative for cough.   Gastrointestinal: Negative for nausea.  Genitourinary: Positive for dysuria, flank pain, frequency, hematuria and urgency.  Musculoskeletal: Negative for myalgias.  Skin: Negative for itching and rash.  Neurological: Negative for dizziness and headaches.  Psychiatric/Behavioral: Negative for depression.    The problem list and medications were reviewed and updated by myself where necessary and exist elsewhere in the encounter.   OBJECTIVE:  BP 114/80   Pulse 88   Temp 98.2 F (36.8 C) (Oral)   Resp 18   Ht 5\' 2"  (1.575 m)   Wt 146 lb (66.2 kg)   SpO2 95%   BMI 26.70 kg/m   Physical Exam  Cardiovascular: Normal rate and regular rhythm.   Pulmonary/Chest: Effort normal and breath sounds normal.  Abdominal: Soft. Bowel sounds are normal. She exhibits no distension and no mass. There is no tenderness. There is CVA tenderness (bilaterally). There is no rebound and no guarding.  Musculoskeletal: Normal range of motion.  Neurological: She is alert.    Results for orders placed or performed in visit on 11/29/15 (from the past 72 hour(s))    POCT urinalysis dipstick     Status: Abnormal   Collection Time: 11/29/15  3:36 PM  Result Value Ref Range   Color, UA orange (A) yellow   Clarity, UA clear clear   Glucose, UA =100 (A) negative   Bilirubin, UA negative negative   Ketones, POC UA trace (5) (A) negative   Spec Grav, UA 1.010    Blood, UA small (A) negative   pH, UA 7.0    Protein Ur, POC =100 (A) negative   Urobilinogen, UA 2.0    Nitrite, UA Positive (A) Negative   Leukocytes, UA large (3+) (A) Negative  POCT Microscopic Urinalysis ([facility])     Status: Abnormal   Collection Time: 11/29/15  3:36 PM  Result Value Ref Range   WBC,UR,HPF,POC Too numerous to count  (A) None WBC/hpf   RBC,UR,HPF,POC Few (A) None RBC/hpf   Bacteria Many (A) None, Too numerous to count   Mucus Absent Absent   Epithelial Cells, UR Per Microscopy None None, Too numerous to count cells/hpf  POCT CBC     Status: None   Collection Time: 11/29/15  3:55 PM  Result Value Ref Range   WBC 8.6 4.6 - 10.2 K/uL   Lymph, poc 2.1 0.6 - 3.4   POC LYMPH PERCENT 24.4 10 - 50 %L   MID (cbc) 0.4 0 - 0.9   POC MID % 5.0 0 - 12 %M   POC Granulocyte 6.1 2 - 6.9   Granulocyte percent  70.6 37 - 80 %G   RBC 4.47 4.04 - 5.48 M/uL   Hemoglobin 13.2 12.2 - 16.2 g/dL   HCT, POC 16.137.9 09.637.7 - 47.9 %   MCV 85.0 80 - 97 fL   MCH, POC 29.5 27 - 31.2 pg   MCHC 34.8 31.8 - 35.4 g/dL   RDW, POC 04.513.8 %   Platelet Count, POC 255 142 - 424 K/uL   MPV 7.8 0 - 99.8 fL    No results found.  ASSESSMENT AND PLAN  Dois DavenportSandra was seen today for cystitis.  Diagnoses and all orders for this visit:  Burning with urination -     POCT urinalysis dipstick -     POCT Microscopic Urinalysis ([facility])  Acute cystitis with hematuria: She is having flank pain however here CBC is normal.  Will extend Cipro to a ten day course.  Close follow up as needed.  Culture pending.  -     Urine culture -     ciprofloxacin (CIPRO) 500 MG tablet; Take 1 tablet (500 mg total) by mouth 2 (two)  times daily.  Flank pain, acute -     POCT CBC    The patient is advised to call or return to clinic if she does not see an improvement in symptoms, or to seek the care of the closest emergency department if she worsens with the above plan.   Deliah BostonMichael Donato Studley, MHS, PA-C [facility] 11/29/2015 4:03 PM

## 2015-12-01 LAB — URINE CULTURE

## 2015-12-02 NOTE — Progress Notes (Signed)
She should finish the abx.  RTC if not better by now. Deliah BostonMichael Denis Carreon, MS, PA-C 1:29 PM, 12/02/2015

## 2016-09-30 ENCOUNTER — Other Ambulatory Visit: Payer: Self-pay | Admitting: Obstetrics and Gynecology

## 2017-01-15 ENCOUNTER — Other Ambulatory Visit: Payer: Self-pay | Admitting: Family Medicine

## 2017-01-15 DIAGNOSIS — Z1231 Encounter for screening mammogram for malignant neoplasm of breast: Secondary | ICD-10-CM

## 2017-02-15 ENCOUNTER — Ambulatory Visit: Payer: Medicare Other

## 2017-03-16 ENCOUNTER — Ambulatory Visit: Payer: Medicare Other

## 2017-04-05 ENCOUNTER — Ambulatory Visit
Admission: RE | Admit: 2017-04-05 | Discharge: 2017-04-05 | Disposition: A | Discharge status: Home / Self Care | Payer: Medicare Other | Source: Ambulatory Visit | Attending: Family Medicine | Admitting: Family Medicine

## 2017-04-05 DIAGNOSIS — Z1231 Encounter for screening mammogram for malignant neoplasm of breast: Secondary | ICD-10-CM

## 2017-04-20 ENCOUNTER — Other Ambulatory Visit: Payer: Self-pay | Admitting: Family Medicine

## 2017-04-20 DIAGNOSIS — M85852 Other specified disorders of bone density and structure, left thigh: Secondary | ICD-10-CM

## 2017-04-21 ENCOUNTER — Other Ambulatory Visit: Payer: Self-pay | Admitting: Urology

## 2017-05-11 ENCOUNTER — Other Ambulatory Visit: Payer: Medicare Other

## 2017-05-18 ENCOUNTER — Ambulatory Visit
Admission: RE | Admit: 2017-05-18 | Discharge: 2017-05-18 | Disposition: A | Discharge status: Home / Self Care | Payer: Medicare Other | Source: Ambulatory Visit | Attending: Family Medicine | Admitting: Family Medicine

## 2017-05-18 DIAGNOSIS — M85852 Other specified disorders of bone density and structure, left thigh: Secondary | ICD-10-CM

## 2017-07-06 ENCOUNTER — Encounter (HOSPITAL_COMMUNITY): Payer: Self-pay

## 2017-07-06 NOTE — Patient Instructions (Addendum)
Catherine Arias  07/06/2017   Your procedure is scheduled on: 07-13-17   Report to [facility] Main  Entrance              Report to admitting at      0530 AM    Call this number if you have problems the morning of surgery [phone]   Remember: Do not eat food or drink liquids :After Midnight.     Take these medicines the morning of surgery with A SIP OF WATER: vesicare                                You may not have any metal on your body including hair pins and              piercings  Do not wear jewelry, make-up, lotions, powders or perfumes, deodorant             Do not wear nail polish.  Do not shave  48 hours prior to surgery.     Do not bring valuables to the hospital. [facility] IS NOT             RESPONSIBLE   FOR VALUABLES.  Contacts, dentures or bridgework may not be worn into surgery.  Leave suitcase in the car. After surgery it may be brought to your room.                  Please read over the following fact sheets you were given: _____________________________________________________________________          [facility] - Preparing for Surgery Before surgery, you can play an important role.  Because skin is not sterile, your skin needs to be as free of germs as possible.  You can reduce the number of germs on your skin by washing with CHG (chlorahexidine gluconate) soap before surgery.  CHG is an antiseptic cleaner which kills germs and bonds with the skin to continue killing germs even after washing. Please DO NOT use if you have an allergy to CHG or antibacterial soaps.  If your skin becomes reddened/irritated stop using the CHG and inform your nurse when you arrive at Short Stay. Do not shave (including legs and underarms) for at least 48 hours prior to the first CHG shower.  You may shave your face/neck. Please follow these instructions carefully:  1.  Shower with CHG Soap the night before surgery and the  morning of Surgery.  2.  If  you choose to wash your hair, wash your hair first as usual with your  normal  shampoo.  3.  After you shampoo, rinse your hair and body thoroughly to remove the  shampoo.                           4.  Use CHG as you would any other liquid soap.  You can apply chg directly  to the skin and wash                       Gently with a scrungie or clean washcloth.  5.  Apply the CHG Soap to your body ONLY FROM THE NECK DOWN.   Do not use on face/ open  Wound or open sores. Avoid contact with eyes, ears mouth and genitals (private parts).                       Wash face,  Genitals (private parts) with your normal soap.             6.  Wash thoroughly, paying special attention to the area where your surgery  will be performed.  7.  Thoroughly rinse your body with warm water from the neck down.  8.  DO NOT shower/wash with your normal soap after using and rinsing off  the CHG Soap.                9.  Pat yourself dry with a clean towel.            10.  Wear clean pajamas.            11.  Place clean sheets on your bed the night of your first shower and do not  sleep with pets. Day of Surgery : Do not apply any lotions/deodorants the morning of surgery.  Please wear clean clothes to the hospital/surgery center.  FAILURE TO FOLLOW THESE INSTRUCTIONS MAY RESULT IN THE CANCELLATION OF YOUR SURGERY PATIENT SIGNATURE_________________________________  NURSE SIGNATURE__________________________________  ________________________________________________________________________  WHAT IS A BLOOD TRANSFUSION? Blood Transfusion Information  A transfusion is the replacement of blood or some of its parts. Blood is made up of multiple cells which provide different functions.  Red blood cells carry oxygen and are used for blood loss replacement.  White blood cells fight against infection.  Platelets control bleeding.  Plasma helps clot blood.  Other blood products are available for  specialized needs, such as hemophilia or other clotting disorders. BEFORE THE TRANSFUSION  Who gives blood for transfusions?   Healthy volunteers who are fully evaluated to make sure their blood is safe. This is blood bank blood. Transfusion therapy is the safest it has ever been in the practice of medicine. Before blood is taken from a donor, a complete history is taken to make sure that person has no history of diseases nor engages in risky social behavior (examples are intravenous drug use or sexual activity with multiple partners). The donor's travel history is screened to minimize risk of transmitting infections, such as malaria. The donated blood is tested for signs of infectious diseases, such as HIV and hepatitis. The blood is then tested to be sure it is compatible with you in order to minimize the chance of a transfusion reaction. If you or a relative donates blood, this is often done in anticipation of surgery and is not appropriate for emergency situations. It takes many days to process the donated blood. RISKS AND COMPLICATIONS Although transfusion therapy is very safe and saves many lives, the main dangers of transfusion include:   Getting an infectious disease.  Developing a transfusion reaction. This is an allergic reaction to something in the blood you were given. Every precaution is taken to prevent this. The decision to have a blood transfusion has been considered carefully by your caregiver before blood is given. Blood is not given unless the benefits outweigh the risks. AFTER THE TRANSFUSION  Right after receiving a blood transfusion, you will usually feel much better and more energetic. This is especially true if your red blood cells have gotten low (anemic). The transfusion raises the level of the red blood cells which carry oxygen, and this usually causes an energy increase.  The  nurse administering the transfusion will monitor you carefully for complications. HOME CARE  INSTRUCTIONS  No special instructions are needed after a transfusion. You may find your energy is better. Speak with your caregiver about any limitations on activity for underlying diseases you may have. SEEK MEDICAL CARE IF:   Your condition is not improving after your transfusion.  You develop redness or irritation at the intravenous (IV) site. SEEK IMMEDIATE MEDICAL CARE IF:  Any of the following symptoms occur over the next 12 hours:  Shaking chills.  You have a temperature by mouth above 102 F (38.9 C), not controlled by medicine.  Chest, back, or muscle pain.  People around you feel you are not acting correctly or are confused.  Shortness of breath or difficulty breathing.  Dizziness and fainting.  You get a rash or develop hives.  You have a decrease in urine output.  Your urine turns a dark color or changes to pink, red, or brown. Any of the following symptoms occur over the next 10 days:  You have a temperature by mouth above 102 F (38.9 C), not controlled by medicine.  Shortness of breath.  Weakness after normal activity.  The white part of the eye turns yellow (jaundice).  You have a decrease in the amount of urine or are urinating less often.  Your urine turns a dark color or changes to pink, red, or brown. Document Released: 02/21/2000 Document Revised: 05/18/2011 Document Reviewed: 10/10/2007 [facility] Patient Information 2014 [place], [place].  _______________________________________________________________________

## 2017-07-09 ENCOUNTER — Encounter (HOSPITAL_COMMUNITY)
Admission: RE | Admit: 2017-07-09 | Discharge: 2017-07-09 | Disposition: A | Discharge status: Home / Self Care | Payer: Medicare Other | Source: Ambulatory Visit | Attending: Urology | Admitting: Urology

## 2017-07-09 ENCOUNTER — Encounter (HOSPITAL_COMMUNITY): Payer: Self-pay

## 2017-07-09 ENCOUNTER — Other Ambulatory Visit: Payer: Self-pay

## 2017-07-09 DIAGNOSIS — Z01812 Encounter for preprocedural laboratory examination: Secondary | ICD-10-CM | POA: Diagnosis present

## 2017-07-09 DIAGNOSIS — Z01810 Encounter for preprocedural cardiovascular examination: Secondary | ICD-10-CM | POA: Diagnosis not present

## 2017-07-09 DIAGNOSIS — N814 Uterovaginal prolapse, unspecified: Secondary | ICD-10-CM | POA: Insufficient documentation

## 2017-07-09 DIAGNOSIS — R9431 Abnormal electrocardiogram [ECG] [EKG]: Secondary | ICD-10-CM | POA: Insufficient documentation

## 2017-07-09 HISTORY — DX: Nausea with vomiting, unspecified: R11.2

## 2017-07-09 HISTORY — DX: Other specified postprocedural states: Z98.890

## 2017-07-09 HISTORY — DX: Gastro-esophageal reflux disease without esophagitis: K21.9

## 2017-07-09 HISTORY — DX: Pneumonia, unspecified organism: J18.9

## 2017-07-09 HISTORY — DX: Essential (primary) hypertension: I10

## 2017-07-09 LAB — CBC
HCT: 39.6 % (ref 36.0–46.0)
Hemoglobin: 13.2 g/dL (ref 12.0–15.0)
MCH: 28.6 pg (ref 26.0–34.0)
MCHC: 33.3 g/dL (ref 30.0–36.0)
MCV: 85.7 fL (ref 78.0–100.0)
Platelets: 261 10*3/uL (ref 150–400)
RBC: 4.62 MIL/uL (ref 3.87–5.11)
RDW: 14.1 % (ref 11.5–15.5)
WBC: 6.1 10*3/uL (ref 4.0–10.5)

## 2017-07-09 LAB — BASIC METABOLIC PANEL
Anion gap: 11 (ref 5–15)
BUN: 21 mg/dL — ABNORMAL HIGH (ref 6–20)
CO2: 25 mmol/L (ref 22–32)
Calcium: 9 mg/dL (ref 8.9–10.3)
Chloride: 102 mmol/L (ref 101–111)
Creatinine, Ser: 0.92 mg/dL (ref 0.44–1.00)
GFR calc Af Amer: >60 mL/min (ref >60)
GFR calc non Af Amer: 60 mL/min — ABNORMAL LOW (ref >60)
Glucose, Bld: 96 mg/dL (ref 65–99)
Potassium: 3.7 mmol/L (ref 3.5–5.1)
Sodium: 138 mmol/L (ref 135–145)

## 2017-07-09 LAB — PROTIME-INR
INR: 1
Prothrombin Time: 13.1 seconds (ref 11.4–15.2)

## 2017-07-10 LAB — ABO/RH: ABO/RH(D): O POS

## 2017-07-12 MED ORDER — GENTAMICIN SULFATE 40 MG/ML IJ SOLN
280.0000 mg | INTRAVENOUS | Status: AC
  Administered 2017-07-13: 280 mg via INTRAVENOUS
  Filled 2017-07-12: qty 7

## 2017-07-12 NOTE — Anesthesia Preprocedure Evaluation (Addendum)
Anesthesia Evaluation  Patient identified by MRN, date of birth, ID band Patient awake    Reviewed: Allergy & Precautions, NPO status , Patient's Chart, lab work & pertinent test results  History of Anesthesia Complications (+) PONV  Airway Mallampati: II  TM Distance: >3 FB Neck ROM: Full    Dental  (+) Dental Advisory Given, Teeth Intact   Pulmonary neg pulmonary ROS,    breath sounds clear to auscultation       Cardiovascular Exercise Tolerance: Good hypertension, Pt. on medications  Rhythm:Regular Rate:Normal     Neuro/Psych negative neurological ROS  negative psych ROS   GI/Hepatic Neg liver ROS, GERD  Medicated and Controlled,  Endo/Other  Obesity  Renal/GU negative Renal ROS  Female GU complaint     Musculoskeletal  (+) Arthritis ,   Abdominal   Peds  Hematology negative hematology ROS (+)   Anesthesia Other Findings   Reproductive/Obstetrics                            Anesthesia Physical Anesthesia Plan  ASA: II  Anesthesia Plan: General   Post-op Pain Management:    Induction: Intravenous  PONV Risk Score and Plan: 4 or greater and Treatment may vary due to age or medical condition, Ondansetron, Dexamethasone and Scopolamine patch - Pre-op  Airway Management Planned: Oral ETT  Additional Equipment: None  Intra-op Plan:   Post-operative Plan: Extubation in OR  Informed Consent: I have reviewed the patients History and Physical, chart, labs and discussed the procedure including the risks, benefits and alternatives for the proposed anesthesia with the patient or authorized representative who has indicated his/her understanding and acceptance.   Dental advisory given  Plan Discussed with: CRNA and Anesthesiologist  Anesthesia Plan Comments:         Anesthesia Quick Evaluation

## 2017-07-12 NOTE — H&P (Signed)
Pt presents today for pre-operative history and physical exam in anticipation of cystoscopy and cystocele/rectocele/vault prolapse repair by Dr. Sherron Monday on 07/13/17. She is doing well and has not had any urinary tract infections since her last eval. She is still pleased with Vesicare. Pt denies F/C, HA, CP, SOB, N/V, diarrhea/constipation, back pain, flank pain, hematuria, and dysuria.   HX:   I was consulted by the above provider to assess the patient's urinary frequency and mild incontinence. Sometimes she leaks with coughing and sneezing. Her primary complaint is sudden urgency and urge incontinence. It is a daily problem but she does not wear a pad. She denies enuresis   She is failed trospium and [place]. Gala Murdoch worked the best but [employer] no longer pay for it. She is on Vesicare 10 mg and helps some. she failed myrbetriq   mild to moderate grade 3 cystocele with central defect with just reached the introitus. Her vaginal cuff to send up from 8 or 9 cm to approximately 5cm. She had a small distal grade 2 rectocele. the prolapse is asymptomatic   the urodynamics from last year did not demonstrate stress incontinence with a Valsalva pressure of approximate 90 cm of water. Her bladder capacity was approximately 250 mL. Her bladder was overactive   I called and trimethoprim 100 mg with 30 tablets and 11 refills. Pathophysiology of chronic cystitis discussed. She will stay on Vesicare and hopefully will work better now on trimethoprim. Otherwise I will possible treatment goals and perhaps be to Toviaz samples    She has noted no change in incontinence on the Vesicare and trimethoprim   The patient and I spoke about treatment goals. We came up with the following plan. I gave her Flagyl 500 mg 3 times a day for 7 days and she will see her gynecologist in the odor does not settle down. I gave her several weeks of Toviaz 8 mg samples and I will see her back in 6 weeks. The sampling protocol was  discussed but the drug will still be expensive since she has no coverage. She would like to discuss surgery at that time and I will discuss this in detail. She understands that chronic cystitis and urge incontinence are not related to her prolapse. She does feel little bit uncomfortable from the prolapse   last visit  The patient believes that her prolapse is the cause of her urinary tract infections. I don't think she is on daily trimethoprim by history. The Gala Murdoch helped her frequency and urgency and I gave her tumor month of samples. She understands if she ever had prolapse surgery and likely will not help her incontinence or bladder infections.   We talked about prolapse surgery in detail first watchful waiting versus a pessary. Frequency is stable as is her prolapse   She is not sexually active and would like to try a pessary and Dr. cousins will be consult. I gave the patient 2 more months of samples and I will reassess her in 4 months. We both agreed that a sling would not be performed if she did have a transvaginal vault suspension with cystocele repair and graft and possible rectocele repair. The chance of worsening incontinence was discussed. Mesh issues were described   We will send this note to Dr. cousins   Today  The patient failed a pessary. She does not take daily trimethoprim but has had no infections. Gala Murdoch is still helping and I gave her some samples.   She would like to  proceed with surgery and had no further questions. She would like to have the surgery in March or April of next year and we will arrange this.     ALLERGIES: Amoxicillin TABS - Skin Rash Sulfa Drugs - Skin Rash    MEDICATIONS: Hydrochlorothiazide 12.5 mg tablet  Losartan Potassium 100 mg tablet 0 Oral  Vesicare 10 mg tablet Oral     GU PSH: Hysterectomy Unilat SO - 2016      PSH Notes: Hysterectomy  wisdom teeth  right ear surgery     NON-GU PSH: None   GU PMH: Cystocele, midline -  01/27/2016 Mixed incontinence - 01/27/2016 Nocturia - 01/27/2016 Rectocele - 01/27/2016 Urinary Frequency - 01/27/2016 Overactive bladder, OAB (overactive bladder) - 2016 Urinary Tract Inf, Unspec site, Recurrent UTI (urinary tract infection) - 2016 Urinary Retention, Unspec, Incomplete bladder emptying - 2016    NON-GU PMH: Bacteriuria - 06/26/2016 Encounter for general adult medical examination without abnormal findings, Encounter for preventive health examination - 2016 Personal history of other diseases of the circulatory system, History of hypertension - 2016 Personal history of other diseases of the digestive system, History of esophageal reflux - 2016 Personal history of other diseases of the musculoskeletal system and connective tissue, History of arthritis - 2016 Personal history of other endocrine, nutritional and metabolic disease, History of hypercholesterolemia - 2016    FAMILY HISTORY: Acute Myocardial Infarction - Runs In Family Alzheimer's Disease - Runs In Family Colon Cancer - Runs In Family Death - Father, Mother Death of family member - Runs In Family   SOCIAL HISTORY: Marital Status: Divorced Preferred Language: English; Ethnicity: Not Hispanic Or Latino; Race: White Current Smoking Status: Patient has never smoked.   Tobacco Use Assessment Completed: Used Tobacco in last 30 days? Has never drank.  Drinks 1 caffeinated drink per day. Has not had a blood transfusion. Patient's occupation is/was retired.     Notes: Caffeine use, Number of children, Retired, Never a smoker, Divorced   REVIEW OF SYSTEMS:    GU Review Female:   Patient reports get up at night to urinate and leakage of urine. Patient denies frequent urination, hard to postpone urination, burning /pain with urination, stream starts and stops, trouble starting your stream, have to strain to urinate, and being pregnant.  Gastrointestinal (Upper):   Patient reports indigestion/ heartburn. Patient  denies nausea and vomiting.  Gastrointestinal (Lower):   Patient denies diarrhea and constipation.  Constitutional:   Patient denies fever, night sweats, weight loss, and fatigue.  Skin:   Patient denies skin rash/ lesion and itching.  Eyes:   Patient denies blurred vision and double vision.  Ears/ Nose/ Throat:   Patient denies sore throat and sinus problems.  Hematologic/Lymphatic:   Patient denies swollen glands and easy bruising.  Cardiovascular:   Patient denies leg swelling and chest pains.  Respiratory:   Patient denies shortness of breath and cough.  Endocrine:   Patient denies excessive thirst.  Musculoskeletal:   Patient reports back pain. Patient denies joint pain.  Neurological:   Patient denies headaches and dizziness.  Psychologic:   Patient denies depression and anxiety.   VITAL SIGNS:      07/06/2017 01:23 PM  Weight 158 lb / 71.67 kg  Height 61 in / 154.94 cm  BP 118/76 mmHg  Pulse 70 /min  Temperature 98.4 F / 36.8 C  BMI 29.9 kg/m   MULTI-SYSTEM PHYSICAL EXAMINATION:    Constitutional: Well-nourished. No physical deformities. Normally developed. Good grooming.  Neck: Neck  symmetrical, not swollen. Normal tracheal position.  Respiratory: Normal breath sounds. No labored breathing, no use of accessory muscles.   Cardiovascular: Regular rate and rhythm. No murmur, no gallop. Normal temperature, normal extremity pulses, no swelling.   Lymphatic: No enlargement of neck, axillae, groin.  Skin: No paleness, no jaundice, no cyanosis. No lesion, no ulcer, no rash.  Neurologic / Psychiatric: Oriented to time, oriented to place, oriented to person. No depression, no anxiety, no agitation.  Gastrointestinal: No mass, no tenderness, no rigidity, non obese abdomen.  Eyes: Normal conjunctivae. Normal eyelids.  Ears, Nose, Mouth, and Throat: Left ear no scars, no lesions, no masses. Right ear no scars, no lesions, no masses. Nose no scars, no lesions, no masses. Normal hearing.  Normal lips.  Musculoskeletal: Normal gait and station of head and neck.     PAST DATA REVIEWED:  Source Of History:  Patient  Records Review:   Previous Patient Records  Urine Test Review:   Urinalysis   07/06/17  Urinalysis  Urine Appearance Clear   Urine Color Yellow   Urine Glucose Neg   Urine Bilirubin Neg   Urine Ketones Neg   Urine Specific Gravity 1.025   Urine Blood Neg   Urine pH 5.5   Urine Protein Trace   Urine Urobilinogen 0.2   Urine Nitrites Neg   Urine Leukocyte Esterase 2+   Urine WBC/hpf 0 - 5/hpf   Urine RBC/hpf NS (Not Seen)   Urine Epithelial Cells 6 - 10/hpf   Urine Bacteria Few (10-25/hpf)   Urine Mucous Not Present   Urine Yeast NS (Not Seen)   Urine Trichomonas Not Present   Urine Cystals NS (Not Seen)   Urine Casts NS (Not Seen)   Urine Sperm Not Present    PROCEDURES:          Urinalysis Dipstick Dipstick Cont'd Micro  Color: Yellow Bilirubin: Neg WBC/hpf: 0 - 5/hpf  Appearance: Clear Ketones: Neg RBC/hpf: NS (Not Seen)  Specific Gravity: 1.025 Blood: Neg Bacteria: Few (10-25/hpf)  pH: 5.5 Protein: Trace Cystals: NS (Not Seen)  Glucose: Neg Urobilinogen: 0.2 Casts: NS (Not Seen)    Nitrites: Neg Trichomonas: Not Present    Leukocyte Esterase: 2+ Mucous: Not Present      Epithelial Cells: 6 - 10/hpf      Yeast: NS (Not Seen)      Sperm: Not Present    Notes: MICROSCOPIC PERFORMED ON UNCONCENTRATED URINE    ASSESSMENT:      ICD-10 Details  1 GU:   Cystocele, midline - N81.11   2   Rectocele - N81.6    PLAN:           Orders Labs Urine Culture          Schedule Return Visit/Planned Activity: Keep Scheduled Appointment - Schedule Surgery          Document Letter(s):  Created for Patient: Clinical Summary         Notes:   There are no changes in the patients history or physical exam since last evaluation by Dr. Sherron Monday. Pt is scheduled to undergo cysto with cystocele/rectocele/vault prolapse repair 07/13/17.   Will send  urine for culture to ensure no infection prior to procedure.   All pt's questions were answered to the best of my ability.    After a thorough review of the management options for the patient's condition the patient  elected to proceed with surgical therapy as noted above. We have discussed the potential benefits and  risks of the procedure, side effects of the proposed treatment, the likelihood of the patient achieving the goals of the procedure, and any potential problems that might occur during the procedure or recuperation. Informed consent has been obtained.

## 2017-07-13 ENCOUNTER — Encounter (HOSPITAL_COMMUNITY)
Admission: RE | Disposition: A | Discharge status: Home / Self Care | Payer: Self-pay | Source: Ambulatory Visit | Attending: Urology

## 2017-07-13 ENCOUNTER — Observation Stay (HOSPITAL_COMMUNITY)
Admission: RE | Admit: 2017-07-13 | Discharge: 2017-07-14 | Disposition: A | Discharge status: Home / Self Care | Payer: Medicare Other | Source: Ambulatory Visit | Attending: Urology | Admitting: Urology

## 2017-07-13 ENCOUNTER — Encounter (HOSPITAL_COMMUNITY): Payer: Self-pay

## 2017-07-13 ENCOUNTER — Other Ambulatory Visit: Payer: Self-pay

## 2017-07-13 ENCOUNTER — Ambulatory Visit (HOSPITAL_COMMUNITY): Payer: Medicare Other | Admitting: Anesthesiology

## 2017-07-13 DIAGNOSIS — Z880 Allergy status to penicillin: Secondary | ICD-10-CM | POA: Diagnosis not present

## 2017-07-13 DIAGNOSIS — N814 Uterovaginal prolapse, unspecified: Secondary | ICD-10-CM | POA: Diagnosis present

## 2017-07-13 DIAGNOSIS — K219 Gastro-esophageal reflux disease without esophagitis: Secondary | ICD-10-CM | POA: Diagnosis not present

## 2017-07-13 DIAGNOSIS — Z820 Family history of epilepsy and other diseases of the nervous system: Secondary | ICD-10-CM | POA: Insufficient documentation

## 2017-07-13 DIAGNOSIS — E7800 Pure hypercholesterolemia, unspecified: Secondary | ICD-10-CM | POA: Diagnosis not present

## 2017-07-13 DIAGNOSIS — R32 Unspecified urinary incontinence: Secondary | ICD-10-CM | POA: Diagnosis not present

## 2017-07-13 DIAGNOSIS — N813 Complete uterovaginal prolapse: Principal | ICD-10-CM | POA: Insufficient documentation

## 2017-07-13 DIAGNOSIS — Z79899 Other long term (current) drug therapy: Secondary | ICD-10-CM | POA: Diagnosis not present

## 2017-07-13 DIAGNOSIS — M1990 Unspecified osteoarthritis, unspecified site: Secondary | ICD-10-CM | POA: Insufficient documentation

## 2017-07-13 DIAGNOSIS — Z882 Allergy status to sulfonamides status: Secondary | ICD-10-CM | POA: Insufficient documentation

## 2017-07-13 DIAGNOSIS — Z6829 Body mass index (BMI) 29.0-29.9, adult: Secondary | ICD-10-CM | POA: Insufficient documentation

## 2017-07-13 DIAGNOSIS — Z90710 Acquired absence of both cervix and uterus: Secondary | ICD-10-CM | POA: Insufficient documentation

## 2017-07-13 DIAGNOSIS — Z87440 Personal history of urinary (tract) infections: Secondary | ICD-10-CM | POA: Insufficient documentation

## 2017-07-13 DIAGNOSIS — N816 Rectocele: Secondary | ICD-10-CM | POA: Diagnosis not present

## 2017-07-13 DIAGNOSIS — I10 Essential (primary) hypertension: Secondary | ICD-10-CM | POA: Diagnosis not present

## 2017-07-13 DIAGNOSIS — Z800 Family history of malignant neoplasm of digestive organs: Secondary | ICD-10-CM | POA: Diagnosis not present

## 2017-07-13 DIAGNOSIS — Z8249 Family history of ischemic heart disease and other diseases of the circulatory system: Secondary | ICD-10-CM | POA: Diagnosis not present

## 2017-07-13 DIAGNOSIS — N3281 Overactive bladder: Secondary | ICD-10-CM | POA: Diagnosis not present

## 2017-07-13 DIAGNOSIS — E669 Obesity, unspecified: Secondary | ICD-10-CM | POA: Insufficient documentation

## 2017-07-13 DIAGNOSIS — N8110 Cystocele, unspecified: Secondary | ICD-10-CM | POA: Diagnosis present

## 2017-07-13 HISTORY — PX: ANTERIOR AND POSTERIOR REPAIR: SHX5121

## 2017-07-13 HISTORY — PX: CYSTOSCOPY: SHX5120

## 2017-07-13 LAB — TYPE AND SCREEN
ABO/RH(D): O POS
Antibody Screen: NEGATIVE

## 2017-07-13 LAB — HEMOGLOBIN AND HEMATOCRIT, BLOOD
HCT: 37.6 % (ref 36.0–46.0)
Hemoglobin: 12.2 g/dL (ref 12.0–15.0)

## 2017-07-13 SURGERY — ANTERIOR (CYSTOCELE) AND POSTERIOR REPAIR (RECTOCELE)
Anesthesia: General

## 2017-07-13 MED ORDER — LOSARTAN POTASSIUM 50 MG PO TABS
50.0000 mg | ORAL_TABLET | Freq: Every day | ORAL | Status: DC
  Administered 2017-07-13: 50 mg via ORAL
  Filled 2017-07-13 (×2): qty 1

## 2017-07-13 MED ORDER — ONDANSETRON HCL 4 MG/2ML IJ SOLN
INTRAMUSCULAR | Status: AC
  Filled 2017-07-13: qty 2

## 2017-07-13 MED ORDER — SUGAMMADEX SODIUM 200 MG/2ML IV SOLN
INTRAVENOUS | Status: AC
  Filled 2017-07-13: qty 2

## 2017-07-13 MED ORDER — CLINDAMYCIN PHOSPHATE 600 MG/50ML IV SOLN
600.0000 mg | INTRAVENOUS | Status: AC
  Administered 2017-07-13: 600 mg via INTRAVENOUS
  Filled 2017-07-13: qty 50

## 2017-07-13 MED ORDER — SUGAMMADEX SODIUM 200 MG/2ML IV SOLN
INTRAVENOUS | Status: DC | PRN
  Administered 2017-07-13: 200 mg via INTRAVENOUS

## 2017-07-13 MED ORDER — FENTANYL CITRATE (PF) 250 MCG/5ML IJ SOLN
INTRAMUSCULAR | Status: DC | PRN
  Administered 2017-07-13: 50 ug via INTRAVENOUS
  Administered 2017-07-13: 100 ug via INTRAVENOUS

## 2017-07-13 MED ORDER — FENTANYL CITRATE (PF) 100 MCG/2ML IJ SOLN: 25.0000 ug | INTRAMUSCULAR | Status: DC | PRN

## 2017-07-13 MED ORDER — PROPOFOL 10 MG/ML IV BOLUS
INTRAVENOUS | Status: AC
Start: 2017-07-13 — End: 2017-07-13
  Filled 2017-07-13: qty 20

## 2017-07-13 MED ORDER — HYDROCODONE-ACETAMINOPHEN 5-325 MG PO TABS: 1.0000 | ORAL_TABLET | Freq: Four times a day (QID) | ORAL | 0 refills | Status: DC | PRN

## 2017-07-13 MED ORDER — LACTATED RINGERS IV SOLN
INTRAVENOUS | Status: DC | PRN
  Administered 2017-07-13 (×2): via INTRAVENOUS

## 2017-07-13 MED ORDER — EPHEDRINE SULFATE-NACL 50-0.9 MG/10ML-% IV SOSY
PREFILLED_SYRINGE | INTRAVENOUS | Status: DC | PRN
  Administered 2017-07-13 (×4): 10 mg via INTRAVENOUS

## 2017-07-13 MED ORDER — STERILE WATER FOR IRRIGATION IR SOLN
Status: DC | PRN
  Administered 2017-07-13: 1

## 2017-07-13 MED ORDER — DEXTROSE-NACL 5-0.45 % IV SOLN
INTRAVENOUS | Status: DC
  Administered 2017-07-13: 13:00:00 via INTRAVENOUS

## 2017-07-13 MED ORDER — ROCURONIUM BROMIDE 10 MG/ML (PF) SYRINGE
PREFILLED_SYRINGE | INTRAVENOUS | Status: DC | PRN
  Administered 2017-07-13: 50 mg via INTRAVENOUS
  Administered 2017-07-13: 5 mg via INTRAVENOUS

## 2017-07-13 MED ORDER — DEXAMETHASONE SODIUM PHOSPHATE 10 MG/ML IJ SOLN
INTRAMUSCULAR | Status: DC | PRN
  Administered 2017-07-13: 10 mg via INTRAVENOUS

## 2017-07-13 MED ORDER — LIDOCAINE-EPINEPHRINE (PF) 1 %-1:200000 IJ SOLN
INTRAMUSCULAR | Status: AC
  Filled 2017-07-13: qty 60

## 2017-07-13 MED ORDER — ACETAMINOPHEN 325 MG PO TABS: 650.0000 mg | ORAL_TABLET | ORAL | Status: DC | PRN

## 2017-07-13 MED ORDER — MIDAZOLAM HCL 5 MG/5ML IJ SOLN
INTRAMUSCULAR | Status: DC | PRN
  Administered 2017-07-13: 2 mg via INTRAVENOUS

## 2017-07-13 MED ORDER — CLINDAMYCIN PHOSPHATE 2 % VA CREA
TOPICAL_CREAM | VAGINAL | Status: AC
  Filled 2017-07-13: qty 80

## 2017-07-13 MED ORDER — LIDOCAINE 2% (20 MG/ML) 5 ML SYRINGE
INTRAMUSCULAR | Status: DC | PRN
  Administered 2017-07-13: 60 mg via INTRAVENOUS

## 2017-07-13 MED ORDER — LIDOCAINE-EPINEPHRINE 0.5 %-1:200000 IJ SOLN
INTRAMUSCULAR | Status: AC
  Filled 2017-07-13: qty 1

## 2017-07-13 MED ORDER — PHENAZOPYRIDINE HCL 200 MG PO TABS
200.0000 mg | ORAL_TABLET | ORAL | Status: AC
  Administered 2017-07-13: 200 mg via ORAL
  Filled 2017-07-13: qty 1

## 2017-07-13 MED ORDER — MIDAZOLAM HCL 2 MG/2ML IJ SOLN
INTRAMUSCULAR | Status: AC
  Filled 2017-07-13: qty 2

## 2017-07-13 MED ORDER — SODIUM CHLORIDE 0.9 % IV SOLN
INTRAVENOUS | Status: DC | PRN
  Administered 2017-07-13: 500 mL

## 2017-07-13 MED ORDER — PROMETHAZINE HCL 25 MG/ML IJ SOLN
12.5000 mg | Freq: Four times a day (QID) | INTRAMUSCULAR | Status: DC | PRN
  Administered 2017-07-13: 12.5 mg via INTRAVENOUS
  Filled 2017-07-13: qty 1

## 2017-07-13 MED ORDER — OXYCODONE HCL 5 MG PO TABS: 5.0000 mg | ORAL_TABLET | ORAL | Status: DC | PRN

## 2017-07-13 MED ORDER — SODIUM CHLORIDE 0.9 % IV SOLN
INTRAVENOUS | Status: AC
  Filled 2017-07-13: qty 500000

## 2017-07-13 MED ORDER — FENTANYL CITRATE (PF) 250 MCG/5ML IJ SOLN
INTRAMUSCULAR | Status: AC
  Filled 2017-07-13: qty 5

## 2017-07-13 MED ORDER — DIPHENHYDRAMINE HCL 12.5 MG/5ML PO ELIX: 12.5000 mg | ORAL_SOLUTION | Freq: Four times a day (QID) | ORAL | Status: DC | PRN

## 2017-07-13 MED ORDER — FLEET ENEMA 7-19 GM/118ML RE ENEM
1.0000 | ENEMA | Freq: Once | RECTAL | Status: DC
  Filled 2017-07-13: qty 1

## 2017-07-13 MED ORDER — ONDANSETRON HCL 4 MG/2ML IJ SOLN: 4.0000 mg | Freq: Once | INTRAMUSCULAR | Status: DC | PRN

## 2017-07-13 MED ORDER — CLINDAMYCIN PHOSPHATE 2 % VA CREA
TOPICAL_CREAM | VAGINAL | Status: DC | PRN
  Administered 2017-07-13: 1 via VAGINAL

## 2017-07-13 MED ORDER — LIDOCAINE 2% (20 MG/ML) 5 ML SYRINGE
INTRAMUSCULAR | Status: AC
  Filled 2017-07-13: qty 5

## 2017-07-13 MED ORDER — ONDANSETRON HCL 4 MG/2ML IJ SOLN
INTRAMUSCULAR | Status: DC | PRN
  Administered 2017-07-13: 4 mg via INTRAVENOUS

## 2017-07-13 MED ORDER — OXYCODONE HCL 5 MG/5ML PO SOLN
5.0000 mg | Freq: Once | ORAL | Status: DC | PRN
  Filled 2017-07-13: qty 5

## 2017-07-13 MED ORDER — DEXAMETHASONE SODIUM PHOSPHATE 10 MG/ML IJ SOLN
INTRAMUSCULAR | Status: AC
  Filled 2017-07-13: qty 1

## 2017-07-13 MED ORDER — GLYCOPYRROLATE 0.2 MG/ML IV SOSY
PREFILLED_SYRINGE | INTRAVENOUS | Status: AC
  Filled 2017-07-13: qty 5

## 2017-07-13 MED ORDER — LIDOCAINE-EPINEPHRINE (PF) 1 %-1:200000 IJ SOLN
INTRAMUSCULAR | Status: DC | PRN
  Administered 2017-07-13: 35 mL

## 2017-07-13 MED ORDER — HYDROCHLOROTHIAZIDE 12.5 MG PO CAPS
12.5000 mg | ORAL_CAPSULE | Freq: Every day | ORAL | Status: DC
  Administered 2017-07-13: 12.5 mg via ORAL
  Filled 2017-07-13 (×2): qty 1

## 2017-07-13 MED ORDER — ROCURONIUM BROMIDE 10 MG/ML (PF) SYRINGE
PREFILLED_SYRINGE | INTRAVENOUS | Status: AC
  Filled 2017-07-13: qty 5

## 2017-07-13 MED ORDER — OXYCODONE HCL 5 MG PO TABS: 5.0000 mg | ORAL_TABLET | Freq: Once | ORAL | Status: DC | PRN

## 2017-07-13 MED ORDER — ONDANSETRON HCL 4 MG/2ML IJ SOLN
4.0000 mg | INTRAMUSCULAR | Status: DC | PRN
  Administered 2017-07-13: 4 mg via INTRAVENOUS
  Filled 2017-07-13: qty 2

## 2017-07-13 MED ORDER — MORPHINE SULFATE (PF) 4 MG/ML IV SOLN
2.0000 mg | INTRAVENOUS | Status: DC | PRN
  Administered 2017-07-13: 4 mg via INTRAVENOUS
  Administered 2017-07-13: 2 mg via INTRAVENOUS
  Filled 2017-07-13 (×2): qty 1

## 2017-07-13 MED ORDER — PROPOFOL 10 MG/ML IV BOLUS
INTRAVENOUS | Status: DC | PRN
  Administered 2017-07-13: 160 mg via INTRAVENOUS

## 2017-07-13 MED ORDER — FLUORESCEIN SODIUM 10 % IV SOLN
INTRAVENOUS | Status: AC
  Filled 2017-07-13: qty 5

## 2017-07-13 MED ORDER — ESTRADIOL 0.1 MG/GM VA CREA
TOPICAL_CREAM | VAGINAL | Status: AC
  Filled 2017-07-13: qty 85

## 2017-07-13 MED ORDER — DIPHENHYDRAMINE HCL 50 MG/ML IJ SOLN: 12.5000 mg | Freq: Four times a day (QID) | INTRAMUSCULAR | Status: DC | PRN

## 2017-07-13 SURGICAL SUPPLY — 52 items
BAG URINE DRAINAGE (UROLOGICAL SUPPLIES) ×1 IMPLANT
BAG URO CATCHER STRL LF (MISCELLANEOUS) ×2 IMPLANT
BALLN NEPHROSTOMY (BALLOONS)
BALLOON NEPHROSTOMY (BALLOONS) IMPLANT
BLADE SURG 15 STRL LF DISP TIS (BLADE) ×1 IMPLANT
BLADE SURG 15 STRL SS (BLADE) ×2
CATH FOLEY 2W COUNCIL 5CC 18FR (CATHETERS) IMPLANT
CATH FOLEY 2WAY SLVR  5CC 14FR (CATHETERS) ×1
CATH FOLEY 2WAY SLVR 5CC 14FR (CATHETERS) ×1 IMPLANT
CLOTH BEACON ORANGE TIMEOUT ST (SAFETY) ×2 IMPLANT
COVER FOOTSWITCH UNIV (MISCELLANEOUS) IMPLANT
COVER MAYO STAND STRL (DRAPES) IMPLANT
COVER SURGICAL LIGHT HANDLE (MISCELLANEOUS) ×2 IMPLANT
DEVICE CAPIO SLIM SINGLE (INSTRUMENTS) ×2 IMPLANT
DRAIN PENROSE 18X1/4 LTX STRL (WOUND CARE) ×2 IMPLANT
DRAPE SHEET LG 3/4 BI-LAMINATE (DRAPES) ×2 IMPLANT
ELECT PENCIL ROCKER SW 15FT (MISCELLANEOUS) ×2 IMPLANT
GAUZE 4X4 16PLY RFD (DISPOSABLE) ×4 IMPLANT
GAUZE PACKING 2X5 YD STRL (GAUZE/BANDAGES/DRESSINGS) ×2 IMPLANT
GAUZE SPONGE 4X4 16PLY XRAY LF (GAUZE/BANDAGES/DRESSINGS) ×1 IMPLANT
GLOVE BIO SURGEON STRL SZ 6.5 (GLOVE) ×2 IMPLANT
GLOVE BIOGEL M STRL SZ7.5 (GLOVE) ×2 IMPLANT
GLOVE ECLIPSE 8.5 STRL (GLOVE) ×2 IMPLANT
GOWN STRL REUS W/TWL XL LVL3 (GOWN DISPOSABLE) ×2 IMPLANT
HOLDER FOLEY CATH W/STRAP (MISCELLANEOUS) ×2 IMPLANT
IV NS 1000ML (IV SOLUTION) ×2
IV NS 1000ML BAXH (IV SOLUTION) ×1 IMPLANT
KIT BASIN OR (CUSTOM PROCEDURE TRAY) ×2 IMPLANT
NDL MAYO 6 CRC TAPER PT (NEEDLE) ×1 IMPLANT
NEEDLE HYPO 22GX1.5 SAFETY (NEEDLE) IMPLANT
NEEDLE MAYO 6 CRC TAPER PT (NEEDLE) ×2 IMPLANT
NS IRRIG 1000ML POUR BTL (IV SOLUTION) ×2 IMPLANT
PACK CYSTO (CUSTOM PROCEDURE TRAY) ×2 IMPLANT
PLUG CATH AND CAP STER (CATHETERS) ×2 IMPLANT
RETRACTOR STAY HOOK 5MM (MISCELLANEOUS) ×2 IMPLANT
SHEET LAVH (DRAPES) ×2 IMPLANT
SUT CAPIO ETHIBPND (SUTURE) IMPLANT
SUT VIC AB 0 CT1 27 (SUTURE) ×2
SUT VIC AB 0 CT1 27XBRD ANTBC (SUTURE) ×1 IMPLANT
SUT VIC AB 2-0 CT1 27 (SUTURE) ×4
SUT VIC AB 2-0 CT1 27XBRD (SUTURE) ×2 IMPLANT
SUT VIC AB 2-0 SH 27 (SUTURE) ×8
SUT VIC AB 2-0 SH 27X BRD (SUTURE) ×2 IMPLANT
SUT VIC AB 3-0 SH 27 (SUTURE) ×4
SUT VIC AB 3-0 SH 27XBRD (SUTURE) ×2 IMPLANT
SUT VICRYL 0 UR6 27IN ABS (SUTURE) ×4 IMPLANT
SYR 10ML LL (SYRINGE) ×2 IMPLANT
TOWEL OR 17X26 10 PK STRL BLUE (TOWEL DISPOSABLE) ×2 IMPLANT
TOWEL OR NON WOVEN STRL DISP B (DISPOSABLE) ×2 IMPLANT
TUBING CONNECTING 10 (TUBING) ×2 IMPLANT
WATER STERILE IRR 1000ML POUR (IV SOLUTION) ×2 IMPLANT
YANKAUER SUCT BULB TIP 10FT TU (MISCELLANEOUS) ×2 IMPLANT

## 2017-07-13 NOTE — Progress Notes (Signed)
Pt arrived to floor from PACU on stretcher, slid self to bed. VSS, Some nausea and scant vomitus w/ slide to bed, resolved quickly. Denies pain at present. Drowsy but Ox4. Pt oriented to callbell and environment. POC discussed. Family at bedside.

## 2017-07-13 NOTE — Anesthesia Postprocedure Evaluation (Signed)
Anesthesia Post Note  Patient: Catherine Arias  Procedure(s) Performed: ANTERIOR (CYSTOCELE) AND POSTERIOR REPAIR (RECTOCELE) (N/A ) CYSTOSCOPY (N/A )     Patient location during evaluation: PACU Anesthesia Type: General Level of consciousness: awake and alert Pain management: pain level controlled Vital Signs Assessment: post-procedure vital signs reviewed and stable Respiratory status: spontaneous breathing, nonlabored ventilation and respiratory function stable Cardiovascular status: blood pressure returned to baseline and stable Postop Assessment: no apparent nausea or vomiting Anesthetic complications: no    Last Vitals:  Vitals:   07/13/17 1230 07/13/17 1254  BP: (!) 141/57 140/67  Pulse: 83 87  Resp: 18 16  Temp: 36.4 C 36.6 C  SpO2: 96% 97%    Last Pain:  Vitals:   07/13/17 1254  TempSrc:   PainSc: 0-No pain                 Beryle Lathe

## 2017-07-13 NOTE — Op Note (Signed)
Preoperative diagnosis: Cystocele and rectocele and mild vault prolapse Postoperative diagnosis: Cystocele and rectocele and mild vault prolapse Surgery: Cystocele repair and rectocele repair and cystoscopy Surgeon: Dr. Lorin Picket Mcdiarmid Assistant: Harrie Foreman  The assistant was present and necessary for all steps of the operation described. The assistant played a critical role assisting during the operation  The patient has the above diagnosis and consented to the above procedure.  Extra care was taken with leg positioning to minimize the risk of compartment syndrome and neuropathy and deep vein thrombosis.  Under anesthesia she had a large grade 2 cystocele with moderate central defect.  She had a distal grade 2 rectocele.  Her vaginal cuff was reasonably well supported.  Extra care was taken to place the 3-0 Vicryl and mark the apex using vaginal dimples  I placed 20 cc of lidocaine epinephrine mixture.  I used my Allis clamp technique to make a T-shaped incision starting near the apex.  I mobilized the overlying vaginal epithelium from the underlying pubocervical fascia to the white line bilaterally.  I was diligent mobilizing at the apex.  I was very pleased with the mobilization.  I did a 2 layer anterior repair keeping very good length and  the anatomy correct.  I did not imbricate the bladder neck.  There was very good reduction of the cystocele.  Cystoscopically the patient had a good repair.  Ureteral orifices were effluxing well.  There is no distortion of ureters or injury of the bladder.  I took down the retractor and there is no question the patient did not need a vault suspension with a long anterior vaginal wall that was very deep  I trimmed an appropriate amount of anterior vaginal epithelium and closed with running 2-0 Vicryl on a CT1 needle.  Digital rectal examination confirmed that the patient would benefit from a rectocele repair.  20 cc of lidocaine epinephrine was utilized.  I  placed an Allis clamp on the remnant of the ring and removed a small triangular perineal skin.  She had a scarred distal introitus with a dimple.  I was careful with sharp dissection at this level.  I made a long posterior incision with my Allis technique.  I sharply mobilized the overlying vaginal epithelium from the underlying rectovaginal fascia.  I mobilized very well along the sidewalls and apically.  The overlying vaginal epithelium was very thin  Digital rectal examination demonstrated diffuse defect with modest thinning of the rectovaginal fascia.  I did a 2 layer imbricating rectocele repair starting at the apex.  It was very anatomic with excellent length.  I repeated the rectal examination.  I was very pleased with the repair.  There is no rectal injury or suture in the rectum.  Only a few millimeters of rectal vaginal epithelium was removed.  I closed the posterior vaginal wall with running 2-0 Vicryl on a CT1 needle.  I did 1 gentle perineal body closure with a 0 Vicryl.  The initial running vaginal suture was exteriorized and used to close the perineum subcuticularly.  At the end of the case the patient had excellent vaginal length.  She had excellent support anteriorly and posteriorly.  She had no vaginal narrowing.  I WAS very pleased with the surgery.  Vaginal pack with antibiotic cream was utilized.  Leg position was excellent.  Blood loss was less than 50 mL.  Hopefully the patient will reach her treatment goal

## 2017-07-13 NOTE — Transfer of Care (Signed)
Immediate Anesthesia Transfer of Care Note  Patient: Catherine Arias  Procedure(s) Performed: ANTERIOR (CYSTOCELE) AND POSTERIOR REPAIR (RECTOCELE) (N/A ) CYSTOSCOPY (N/A )  Patient Location: PACU  Anesthesia Type:General  Level of Consciousness: awake, alert , oriented and patient cooperative  Airway & Oxygen Therapy: Patient Spontanous Breathing and Patient connected to face mask oxygen  Post-op Assessment: Report given to RN, Post -op Vital signs reviewed and stable and Patient moving all extremities  Post vital signs: Reviewed and stable  Last Vitals:  Vitals Value Taken Time  BP 142/60 07/13/2017 11:45 AM  Temp    Pulse 97 07/13/2017 11:46 AM  Resp 14 07/13/2017 11:46 AM  SpO2 98 % 07/13/2017 11:46 AM  Vitals shown include unvalidated device data.  Last Pain:  Vitals:   07/13/17 0610  TempSrc: Oral  PainSc:          Complications: No apparent anesthesia complications

## 2017-07-13 NOTE — Interval H&P Note (Signed)
History and Physical Interval Note:  07/13/2017 7:12 AM  Catherine Arias  has presented today for surgery, with the diagnosis of CYSTOCELE, RECTOCELE, VAULT PROLAPSE  The various methods of treatment have been discussed with the patient and family. After consideration of risks, benefits and other options for treatment, the patient has consented to  Procedure(s): ANTERIOR (CYSTOCELE) AND POSTERIOR REPAIR (RECTOCELE) (N/A) VAGINAL VAULT PROLAPSE REPAIR AND GRAFT (N/A) CYSTOSCOPY (N/A) as a surgical intervention .  The patient's history has been reviewed, patient examined, no change in status, stable for surgery.  I have reviewed the patient's chart and labs.  Questions were answered to the patient's satisfaction.     Luverna Degenhart A

## 2017-07-13 NOTE — Anesthesia Procedure Notes (Signed)
Procedure Name: Intubation Date/Time: 07/13/2017 7:47 AM Performed by: Lorelee Market, CRNA Pre-anesthesia Checklist: Patient identified, Emergency Drugs available, Suction available, Timeout performed and Patient being monitored Patient Re-evaluated:Patient Re-evaluated prior to induction Oxygen Delivery Method: Circle system utilized Preoxygenation: Pre-oxygenation with 100% oxygen Induction Type: IV induction Ventilation: Mask ventilation without difficulty Laryngoscope Size: Miller and 2 Grade View: Grade I Tube type: Oral Tube size: 7.0 mm Number of attempts: 2 Airway Equipment and Method: Stylet and Oral airway Placement Confirmation: ETT inserted through vocal cords under direct vision,  positive ETCO2 and breath sounds checked- equal and bilateral Secured at: 23 cm Tube secured with: Tape Dental Injury: Teeth and Oropharynx as per pre-operative assessment and Injury to lip  Comments: 1st unsuccessful intubation attempt by EMT student Barbara Cower). 2nd successful intubation attempt by CRNA (R.Eliska Hamil)

## 2017-07-14 DIAGNOSIS — N813 Complete uterovaginal prolapse: Secondary | ICD-10-CM | POA: Diagnosis not present

## 2017-07-14 LAB — BASIC METABOLIC PANEL
Anion gap: 10 (ref 5–15)
BUN: 10 mg/dL (ref 6–20)
CO2: 24 mmol/L (ref 22–32)
Calcium: 8.1 mg/dL — ABNORMAL LOW (ref 8.9–10.3)
Chloride: 102 mmol/L (ref 101–111)
Creatinine, Ser: 0.76 mg/dL (ref 0.44–1.00)
GFR calc Af Amer: >60 mL/min (ref >60)
GFR calc non Af Amer: >60 mL/min (ref >60)
Glucose, Bld: 141 mg/dL — ABNORMAL HIGH (ref 65–99)
Potassium: 3 mmol/L — ABNORMAL LOW (ref 3.5–5.1)
Sodium: 136 mmol/L (ref 135–145)

## 2017-07-14 LAB — HEMOGLOBIN AND HEMATOCRIT, BLOOD
HCT: 33.6 % — ABNORMAL LOW (ref 36.0–46.0)
Hemoglobin: 11.1 g/dL — ABNORMAL LOW (ref 12.0–15.0)

## 2017-07-14 MED ORDER — PROMETHAZINE HCL 25 MG/ML IJ SOLN: 12.5000 mg | Freq: Four times a day (QID) | INTRAMUSCULAR | Status: DC | PRN

## 2017-07-14 MED ORDER — ONDANSETRON HCL 4 MG/2ML IJ SOLN: 4.0000 mg | INTRAMUSCULAR | Status: DC | PRN

## 2017-07-14 NOTE — Discharge Instructions (Signed)
I have reviewed discharge instructions in detail with the patient. They will follow-up with me or their physician as scheduled. My nurse will also be calling the patients as per protocol. As discussed with Dr. Suhas Estis. ° °You may resume aspirin, advil, aleve, vitamins, and supplements 7 days after surgery. °

## 2017-07-14 NOTE — Discharge Summary (Signed)
Date of admission: 07/13/2017  Date of discharge: 07/14/2017  Admission diagnosis: cystocele  Discharge diagnosis: cystocele  Secondary diagnoses: rectocele  History and Physical: For full details, please see admission history and physical. Briefly, Catherine Arias is a 72 y.o. year old patient with diagnosis.   Hospital Course: A and P repair; excellent post op course  Laboratory values:  Recent Labs    07/13/17 1533 07/14/17 0510  HGB 12.2 11.1*  HCT 37.6 33.6*   Recent Labs    07/14/17 0510  CREATININE 0.76    Disposition: Home  Discharge instruction: The patient was instructed to be ambulatory but told to refrain from heavy lifting, strenuous activity, or driving. Detaild  Discharge medications:  Allergies as of 07/14/2017      Reactions   Amoxicillin Hives, Other (See Comments)   Has patient had a PCN reaction causing immediate rash, facial/tongue/throat swelling, SOB or lightheadedness with hypotension: Yes Has patient had a PCN reaction causing severe rash involving mucus membranes or skin necrosis: No Has patient had a PCN reaction that required hospitalization: No Has patient had a PCN reaction occurring within the last 10 years: No If all of the above answers are "NO", then may proceed with Cephalosporin use.   Sulfonamide Derivatives Hives      Medication List    STOP taking these medications   CALCIUM-VITAMIN D3 PO   FISH OIL PO     TAKE these medications   hydrochlorothiazide 12.5 MG capsule Commonly known as:  MICROZIDE Take 12.5 mg by mouth daily.   HYDROcodone-acetaminophen 5-325 MG tablet Commonly known as:  NORCO Take 1-2 tablets by mouth every 6 (six) hours as needed for moderate pain or severe pain.   losartan 50 MG tablet Commonly known as:  COZAAR Take 50 mg by mouth daily.   olopatadine 0.1 % ophthalmic solution Commonly known as:  PATANOL Place 1 drop into both eyes 2 (two) times daily.   solifenacin 10 MG tablet Commonly known  as:  VESICARE Take 10 mg by mouth daily.   triamcinolone cream 0.1 % Commonly known as:  KENALOG Apply 1 application topically 2 (two) times daily.       Followup:  Follow-up Information    Chrissie Dacquisto, Lorin Picket, MD.   Specialty:  Urology Why:  office will call you with date and time of appt.  Contact information: [address] [place] [address] [phone]

## 2017-07-14 NOTE — Progress Notes (Signed)
Vitals and labs normal No pain and mobile Reviewed all post op and void trial

## 2017-07-14 NOTE — Plan of Care (Signed)
  Problem: Bowel/Gastric: Goal: Gastrointestinal status for postoperative course will improve 07/14/2017 1307 by Kerrin Mo, RN Outcome: Completed/Met 07/14/2017 1024 by Kerrin Mo, RN Outcome: Progressing   Problem: Clinical Measurements: Goal: Postoperative complications will be avoided or minimized 07/14/2017 1307 by Kerrin Mo, RN Outcome: Completed/Met 07/14/2017 1024 by Kerrin Mo, RN Outcome: Progressing   Problem: Skin Integrity: Goal: Demonstration of wound healing without infection will improve 07/14/2017 1307 by Kerrin Mo, RN Outcome: Completed/Met 07/14/2017 1024 by Kerrin Mo, RN Outcome: Progressing   Problem: Activity: Goal: Risk for activity intolerance will decrease Outcome: Completed/Met   Problem: Pain Managment: Goal: General experience of comfort will improve Outcome: Completed/Met

## 2017-07-14 NOTE — Progress Notes (Signed)
Pt has ambulated independently in halls today and is tolerating a solid diet with no N/V.  She has voided with a PVR of 15ml.  Dr Sherron Monday aware and states pt okay for d/c home. D/c instructions reviewed w/ pt and she verbalizes understanding. All questions answered. Pt d/c in w/c in stable condition by NT to family's car. Pt in possession of d/c packet, script, and all personal belongings.

## 2017-07-14 NOTE — Plan of Care (Signed)
  Problem: Bowel/Gastric: Goal: Gastrointestinal status for postoperative course will improve Outcome: Progressing   Problem: Clinical Measurements: Goal: Postoperative complications will be avoided or minimized Outcome: Progressing   Problem: Skin Integrity: Goal: Demonstration of wound healing without infection will improve Outcome: Progressing

## 2018-02-18 DIAGNOSIS — E785 Hyperlipidemia, unspecified: Secondary | ICD-10-CM | POA: Insufficient documentation

## 2018-02-21 ENCOUNTER — Ambulatory Visit
Admission: RE | Admit: 2018-02-21 | Discharge: 2018-02-21 | Disposition: A | Discharge status: Home / Self Care | Payer: Medicare Other | Source: Ambulatory Visit | Attending: Family Medicine | Admitting: Family Medicine

## 2018-02-21 ENCOUNTER — Other Ambulatory Visit: Payer: Self-pay | Admitting: Family Medicine

## 2018-02-21 DIAGNOSIS — M79672 Pain in left foot: Secondary | ICD-10-CM

## 2018-05-02 ENCOUNTER — Other Ambulatory Visit: Payer: Self-pay | Admitting: Family Medicine

## 2018-05-02 DIAGNOSIS — Z1231 Encounter for screening mammogram for malignant neoplasm of breast: Secondary | ICD-10-CM

## 2018-05-11 ENCOUNTER — Ambulatory Visit
Admission: RE | Admit: 2018-05-11 | Discharge: 2018-05-11 | Disposition: A | Discharge status: Home / Self Care | Payer: Medicare Other | Source: Ambulatory Visit | Attending: Family Medicine | Admitting: Family Medicine

## 2018-05-11 DIAGNOSIS — Z1231 Encounter for screening mammogram for malignant neoplasm of breast: Secondary | ICD-10-CM

## 2018-12-28 DIAGNOSIS — N390 Urinary tract infection, site not specified: Secondary | ICD-10-CM | POA: Insufficient documentation

## 2019-02-01 DIAGNOSIS — N819 Female genital prolapse, unspecified: Secondary | ICD-10-CM | POA: Insufficient documentation

## 2019-05-05 ENCOUNTER — Other Ambulatory Visit: Payer: Self-pay | Admitting: Family Medicine

## 2019-05-05 DIAGNOSIS — Z1231 Encounter for screening mammogram for malignant neoplasm of breast: Secondary | ICD-10-CM

## 2019-06-23 ENCOUNTER — Other Ambulatory Visit: Payer: Self-pay

## 2019-06-23 ENCOUNTER — Ambulatory Visit
Admission: RE | Admit: 2019-06-23 | Discharge: 2019-06-23 | Disposition: A | Discharge status: Home / Self Care | Payer: Medicare Other | Source: Ambulatory Visit | Attending: Family Medicine | Admitting: Family Medicine

## 2019-06-23 DIAGNOSIS — Z1231 Encounter for screening mammogram for malignant neoplasm of breast: Secondary | ICD-10-CM

## 2019-07-24 ENCOUNTER — Other Ambulatory Visit: Payer: Self-pay | Admitting: Family Medicine

## 2019-07-24 DIAGNOSIS — M8588 Other specified disorders of bone density and structure, other site: Secondary | ICD-10-CM

## 2019-07-27 ENCOUNTER — Ambulatory Visit
Admission: RE | Admit: 2019-07-27 | Discharge: 2019-07-27 | Disposition: A | Discharge status: Home / Self Care | Payer: Medicare Other | Source: Ambulatory Visit | Attending: Family Medicine | Admitting: Family Medicine

## 2019-07-27 ENCOUNTER — Other Ambulatory Visit: Payer: Self-pay

## 2019-07-27 DIAGNOSIS — M8588 Other specified disorders of bone density and structure, other site: Secondary | ICD-10-CM

## 2019-10-02 ENCOUNTER — Ambulatory Visit (INDEPENDENT_AMBULATORY_CARE_PROVIDER_SITE_OTHER): Payer: Medicare Other | Admitting: Bariatrics

## 2019-10-02 ENCOUNTER — Encounter (INDEPENDENT_AMBULATORY_CARE_PROVIDER_SITE_OTHER): Payer: Self-pay | Admitting: Bariatrics

## 2019-10-02 ENCOUNTER — Other Ambulatory Visit: Payer: Self-pay

## 2019-10-02 VITALS — BP 140/75 | HR 69 | Temp 98.1°F | Ht 61.0 in | Wt 159.0 lb

## 2019-10-02 DIAGNOSIS — Z6830 Body mass index (BMI) 30.0-30.9, adult: Secondary | ICD-10-CM | POA: Diagnosis not present

## 2019-10-02 DIAGNOSIS — K219 Gastro-esophageal reflux disease without esophagitis: Secondary | ICD-10-CM

## 2019-10-02 DIAGNOSIS — E669 Obesity, unspecified: Secondary | ICD-10-CM

## 2019-10-02 DIAGNOSIS — Z1331 Encounter for screening for depression: Secondary | ICD-10-CM | POA: Diagnosis not present

## 2019-10-02 DIAGNOSIS — D649 Anemia, unspecified: Secondary | ICD-10-CM

## 2019-10-02 DIAGNOSIS — M797 Fibromyalgia: Secondary | ICD-10-CM

## 2019-10-02 DIAGNOSIS — K5909 Other constipation: Secondary | ICD-10-CM

## 2019-10-02 DIAGNOSIS — R0602 Shortness of breath: Secondary | ICD-10-CM

## 2019-10-02 DIAGNOSIS — I10 Essential (primary) hypertension: Secondary | ICD-10-CM | POA: Insufficient documentation

## 2019-10-02 DIAGNOSIS — E559 Vitamin D deficiency, unspecified: Secondary | ICD-10-CM

## 2019-10-02 DIAGNOSIS — R5383 Other fatigue: Secondary | ICD-10-CM | POA: Diagnosis not present

## 2019-10-02 DIAGNOSIS — E6609 Other obesity due to excess calories: Secondary | ICD-10-CM | POA: Insufficient documentation

## 2019-10-02 DIAGNOSIS — Z0289 Encounter for other administrative examinations: Secondary | ICD-10-CM

## 2019-10-02 NOTE — Progress Notes (Signed)
Dear Dr. Tally Joe,   Thank you for referring Catherine Arias to our clinic. The following note includes my evaluation and treatment recommendations.  Chief Complaint:   OBESITY Catherine Arias (MR# [id number]) is a 74 y.o. female who presents for evaluation and treatment of obesity and related comorbidities. Current BMI is Body mass index is 30.04 kg/m.Marland Kitchen Catherine Arias has been struggling with her weight for many years and has been unsuccessful in either losing weight, maintaining weight loss, or reaching her healthy weight goal.  Catherine Arias is currently in the action stage of change and ready to dedicate time achieving and maintaining a healthier weight. Catherine Arias is interested in becoming our patient and working on intensive lifestyle modifications including (but not limited to) diet and exercise for weight loss.  Catherine Arias does not like to cook secondary to time. She craves carbohydrates.  Catherine Arias's habits were reviewed today and are as follows: her desired weight loss is 19-24 lbs, she started gaining weight 7 years ago, her heaviest weight ever was 161 pounds, she craves carbohydrates, she snacks frequently in the evenings, she frequently makes poor food choices, she has problems with excessive hunger, she frequently eats larger portions than normal, she has binge eating behaviors and she struggles with emotional eating.  Depression Screen Catherine Arias's Food and Mood (modified PHQ-9) score was 13.  Depression screen [facility] 2/9 10/02/2019  Decreased Interest 3  Down, Depressed, Hopeless 1  PHQ - 2 Score 4  Altered sleeping 1  Tired, decreased energy 3  Change in appetite 2  Feeling bad or failure about yourself  2  Trouble concentrating 1  Moving slowly or fidgety/restless 0  Suicidal thoughts 0  PHQ-9 Score 13  Difficult doing work/chores Not difficult at all   Subjective:   Other fatigue. Catherine Arias denies daytime somnolence and admits to waking up still tired. Catherine Arias generally gets 8 hours of  sleep per night, and states that she has generally restful sleep. Snoring is present. Apneic episodes are not present. Epworth Sleepiness Score is 3.  SOB (shortness of breath) on exertion. Catherine Arias notes increasing shortness of breath with certain activities and seems to be worsening over time with weight gain. She notes getting out of breath sooner with activity than she used to. This has gotten worse recently. Catherine Arias denies shortness of breath at rest or orthopnea.  Fibromyalgia. Catherine Arias was diagnosed with fibromyalgia 10 years ago and is on no medication. She does report some back pain.  Essential hypertension. Catherine Arias is taking HCTZ. Blood pressure is controlled.  BP Readings from Last 3 Encounters:  10/02/19 (!) 140/75  07/14/17 (!) 105/53  07/09/17 (!) 126/58   Lab Results  Component Value Date   CREATININE 0.76 07/14/2017   CREATININE 0.92 07/09/2017   Other constipation. Catherine Arias denies retained stool. She is taking Fiber-Con.  Low hemoglobin and low hematocrit. Catherine Arias is not on iron supplementation. Hemoglobin was reported as 11.1 with a hematocrit of 33.6.  Vitamin D deficiency. Catherine Arias is taking OTC Vitamin D supplementation.   Gastroesophageal reflux disease, unspecified whether esophagitis present. Catherine Arias is taking Prilosec as needed.  Depression screening. Catherine Arias had a moderately positive depression screen with a PHQ-9 score of 13.  Assessment/Plan:   Other fatigue. Catherine Arias does feel that her weight is causing her energy to be lower than it should be. Fatigue may be related to obesity, depression or many other causes. Labs will be ordered, and in the meanwhile, Catherine Arias will focus on self care including making healthy food choices,  increasing physical activity and focusing on stress reduction. EKG 12-Lead  SOB (shortness of breath) on exertion. Catherine Arias does feel that she gets out of breath more easily that she used to when she exercises. Catherine Arias shortness of breath appears to be  obesity related and exercise induced. She has agreed to work on weight loss and gradually increase exercise to treat her exercise induced shortness of breath. Will continue to monitor closely.  Fibromyalgia. Catherine Arias was instructed to gradually increase her exercise as tolerated.  Essential hypertension. Catherine Arias is working on healthy weight loss and exercise to improve blood pressure control. We will watch for signs of hypotension as she continues her lifestyle modifications. CMP will be checked today.  Other constipation. Catherine Arias was informed that a decrease in bowel movement frequency is normal while losing weight, but stools should not be hard or painful. Orders and follow up as documented in patient record. She will continue Fiber-Con as directed.   Counseling Getting to Good Bowel Health: Your goal is to have one soft bowel movement each day. Drink at least 8 glasses of water each day. Eat plenty of fiber (goal is over 25 grams each day). It is best to get most of your fiber from dietary sources which includes leafy green vegetables, fresh fruit, and whole grains. You may need to add fiber with the help of OTC fiber supplements. These include Metamucil, Citrucel, and Flaxseed. If you are still having trouble, try adding Miralax or Magnesium Citrate. If all of these changes do not work, Dietitiancontact your physician.  Low hemoglobin and low hematocrit.  CBC will be checked today.   Vitamin D deficiency. Low Vitamin D level contributes to fatigue and are associated with obesity, breast, and colon cancer. She agrees to continue to take OTC Vitamin D as directed and will have Vitamin D level checked today.  Gastroesophageal reflux disease, unspecified whether esophagitis present. Intensive lifestyle modifications are the first line treatment for this issue. We discussed several lifestyle modifications today and she will continue to work on diet, exercise and weight loss efforts. Orders and follow up as  documented in patient record. Catherine Arias will continue to take Prilosec if needed.  Counseling . If a person has gastroesophageal reflux disease (GERD), food and stomach acid move back up into the esophagus and cause symptoms or problems such as damage to the esophagus. . Anti-reflux measures include: raising the head of the bed, avoiding tight clothing or belts, avoiding eating late at night, not lying down shortly after mealtime, and achieving weight loss. . Avoid ASA, NSAID's, caffeine, alcohol, and tobacco.  . OTC Pepcid and/or Tums are often very helpful for as needed use.  Marland Kitchen. However, for persisting chronic or daily symptoms, stronger medications like Omeprazole may be needed. . You may need to avoid foods and drinks such as: ? Coffee and tea (with or without caffeine). ? Drinks that contain alcohol. ? Energy drinks and sports drinks. ? Bubbly (carbonated) drinks or sodas. ? Chocolate and cocoa. ? Peppermint and mint flavorings. ? Garlic and onions. ? Horseradish. ? Spicy and acidic foods. These include peppers, chili powder, curry powder, vinegar, hot sauces, and BBQ sauce. ? Citrus fruit juices and citrus fruits, such as oranges, lemons, and limes. ? Tomato-based foods. These include red sauce, chili, salsa, and pizza with red sauce. ? Fried and fatty foods. These include donuts, french fries, potato chips, and high-fat dressings. ? High-fat meats. These include hot dogs, rib eye steak, sausage, ham, and bacon.  Depression screening.  Catherine Arias had a positive depression screening. Depression is commonly associated with obesity and often results in emotional eating behaviors. We will monitor this closely and work on CBT to help improve the non-hunger eating patterns. Referral to Psychology may be required if no improvement is seen as she continues in our clinic.  Class 1 obesity with serious comorbidity and body mass index (BMI) of 30.0 to 30.9 in adult, unspecified obesity type.  Catherine Arias is  currently in the action stage of change and her goal is to continue with weight loss efforts. I recommend Catherine Arias begin the structured treatment plan as follows:  She has agreed to the Category 1 Plan.  She will work on meal planning, intentional eating, and avoiding sugary drinks.  We reviewed with the patient labs from 07/14/2017 including CMP, CBC, hemoglobin & hematocrit, and glucose.  Exercise goals: Older adults should follow the adult guidelines. When older adults cannot meet the adult guidelines, they should be as physically active as their abilities and conditions will allow.    Behavioral modification strategies: increasing lean protein intake, decreasing simple carbohydrates, increasing vegetables, increasing water intake, decreasing eating out, no skipping meals, meal planning and cooking strategies, keeping healthy foods in the home and planning for success.  She was informed of the importance of frequent follow-up visits to maximize her success with intensive lifestyle modifications for her multiple health conditions. She was informed we would discuss her lab results at her next visit unless there is a critical issue that needs to be addressed sooner. Catherine Arias agreed to keep her next visit at the agreed upon time to discuss these results.  Objective:   Blood pressure (!) 140/75, pulse 69, temperature 98.1 F (36.7 C), height 5\' 1"  (1.549 m), weight 159 lb (72.1 kg), SpO2 94 %. Body mass index is 30.04 kg/m.  EKG: Sinus  Rhythm with a rate of 72 BPM. Poor R-wave progression - nonspecific - may be related to body habitus. Nonspecific T-abnormality. Otherwise normal.  Indirect Calorimeter completed today shows a VO2 of 203 and a REE of 1413.  Her calculated basal metabolic rate is thus her basal metabolic rate is better than expected.  General: Cooperative, alert, well developed, in no acute distress. HEENT: Conjunctivae and lids unremarkable. Cardiovascular: Regular rhythm.    Lungs: Normal work of breathing. Neurologic: No focal deficits.   Lab Results  Component Value Date   CREATININE 0.76 07/14/2017   BUN 10 07/14/2017   NA 136 07/14/2017   K 3.0 (L) 07/14/2017   CL 102 07/14/2017   CO2 24 07/14/2017   No results found for: ALT, AST, GGT, ALKPHOS, BILITOT No results found for: HGBA1C No results found for: INSULIN No results found for: TSH No results found for: CHOL, HDL, LDLCALC, LDLDIRECT, TRIG, CHOLHDL Lab Results  Component Value Date   WBC 6.1 07/09/2017   HGB 11.1 (L) 07/14/2017   HCT 33.6 (L) 07/14/2017   MCV 85.7 07/09/2017   PLT 261 07/09/2017   No results found for: IRON, TIBC, FERRITIN  Obesity Behavioral Intervention Visit Documentation for Insurance:   Approximately 15 minutes were spent on the discussion below.  ASK: We discussed the diagnosis of obesity with 09/08/2017 today and Siyana agreed to give Catherine Arias permission to discuss obesity behavioral modification therapy today.  ASSESS: Cameren has the diagnosis of obesity and her BMI today is 30.2. Taunia is in the action stage of change.   ADVISE: Messina was educated on the multiple health risks of obesity as well as  the benefit of weight loss to improve her health. She was advised of the need for long term treatment and the importance of lifestyle modifications to improve her current health and to decrease her risk of future health problems.  AGREE: Multiple dietary modification options and treatment options were discussed and Catherine Arias agreed to follow the recommendations documented in the above note.  ARRANGE: Catherine Arias was educated on the importance of frequent visits to treat obesity as outlined per CMS and USPSTF guidelines and agreed to schedule her next follow up appointment today.  Attestation Statements:   Reviewed by clinician on day of visit: allergies, medications, problem list, medical history, surgical history, family history, social history, and previous encounter  notes.  Fernanda Drum, am acting as Energy manager for [employer], DO   I have reviewed the above documentation for accuracy and completeness, and I agree with the above. Corinna Capra, DO

## 2019-10-03 ENCOUNTER — Encounter (INDEPENDENT_AMBULATORY_CARE_PROVIDER_SITE_OTHER): Payer: Self-pay | Admitting: Bariatrics

## 2019-10-03 DIAGNOSIS — R7303 Prediabetes: Secondary | ICD-10-CM | POA: Insufficient documentation

## 2019-10-03 LAB — COMPREHENSIVE METABOLIC PANEL
ALT: 14 IU/L (ref 0–32)
AST: 12 IU/L (ref 0–40)
Albumin/Globulin Ratio: 2 (ref 1.2–2.2)
Albumin: 4.3 g/dL (ref 3.7–4.7)
Alkaline Phosphatase: 94 IU/L (ref 48–121)
BUN/Creatinine Ratio: 21 (ref 12–28)
BUN: 18 mg/dL (ref 8–27)
Bilirubin Total: <0.2 mg/dL (ref 0.0–1.2)
CO2: 23 mmol/L (ref 20–29)
Calcium: 9.6 mg/dL (ref 8.7–10.3)
Chloride: 102 mmol/L (ref 96–106)
Creatinine, Ser: 0.87 mg/dL (ref 0.57–1.00)
GFR calc Af Amer: 75 mL/min/{1.73_m2} (ref >59)
GFR calc non Af Amer: 65 mL/min/{1.73_m2} (ref >59)
Globulin, Total: 2.1 g/dL (ref 1.5–4.5)
Glucose: 86 mg/dL (ref 65–99)
Potassium: 4.3 mmol/L (ref 3.5–5.2)
Sodium: 140 mmol/L (ref 134–144)
Total Protein: 6.4 g/dL (ref 6.0–8.5)

## 2019-10-03 LAB — CBC WITH DIFFERENTIAL/PLATELET
Basophils Absolute: 0.1 10*3/uL (ref 0.0–0.2)
Basos: 1 %
EOS (ABSOLUTE): 0.1 10*3/uL (ref 0.0–0.4)
Eos: 2 %
Hematocrit: 39.3 % (ref 34.0–46.6)
Hemoglobin: 13.2 g/dL (ref 11.1–15.9)
Immature Grans (Abs): 0 10*3/uL (ref 0.0–0.1)
Immature Granulocytes: 0 %
Lymphocytes Absolute: 1.5 10*3/uL (ref 0.7–3.1)
Lymphs: 22 %
MCH: 28.2 pg (ref 26.6–33.0)
MCHC: 33.6 g/dL (ref 31.5–35.7)
MCV: 84 fL (ref 79–97)
Monocytes Absolute: 0.4 10*3/uL (ref 0.1–0.9)
Monocytes: 6 %
Neutrophils Absolute: 4.5 10*3/uL (ref 1.4–7.0)
Neutrophils: 69 %
Platelets: 250 10*3/uL (ref 150–450)
RBC: 4.68 x10E6/uL (ref 3.77–5.28)
RDW: 13.8 % (ref 11.7–15.4)
WBC: 6.5 10*3/uL (ref 3.4–10.8)

## 2019-10-03 LAB — LIPID PANEL WITH LDL/HDL RATIO
Cholesterol, Total: 274 mg/dL — ABNORMAL HIGH (ref 100–199)
HDL: 44 mg/dL (ref >39)
LDL Chol Calc (NIH): 207 mg/dL — ABNORMAL HIGH (ref 0–99)
LDL/HDL Ratio: 4.7 ratio — ABNORMAL HIGH (ref 0.0–3.2)
Triglycerides: 128 mg/dL (ref 0–149)
VLDL Cholesterol Cal: 23 mg/dL (ref 5–40)

## 2019-10-03 LAB — T4, FREE: Free T4: 1.05 ng/dL (ref 0.82–1.77)

## 2019-10-03 LAB — TSH: TSH: 2.27 u[IU]/mL (ref 0.450–4.500)

## 2019-10-03 LAB — VITAMIN D 25 HYDROXY (VIT D DEFICIENCY, FRACTURES): Vit D, 25-Hydroxy: 30.7 ng/mL (ref 30.0–100.0)

## 2019-10-03 LAB — INSULIN, RANDOM: INSULIN: 17.9 u[IU]/mL (ref 2.6–24.9)

## 2019-10-03 LAB — HEMOGLOBIN A1C
Est. average glucose Bld gHb Est-mCnc: 117 mg/dL
Hgb A1c MFr Bld: 5.7 % — ABNORMAL HIGH (ref 4.8–5.6)

## 2019-10-03 LAB — T3: T3, Total: 109 ng/dL (ref 71–180)

## 2019-10-16 ENCOUNTER — Ambulatory Visit (INDEPENDENT_AMBULATORY_CARE_PROVIDER_SITE_OTHER): Payer: Medicare Other | Admitting: Bariatrics

## 2019-10-16 ENCOUNTER — Other Ambulatory Visit: Payer: Self-pay

## 2019-10-16 ENCOUNTER — Encounter (INDEPENDENT_AMBULATORY_CARE_PROVIDER_SITE_OTHER): Payer: Self-pay | Admitting: Bariatrics

## 2019-10-16 VITALS — BP 142/80 | HR 69 | Temp 97.7°F | Ht 61.0 in | Wt 155.0 lb

## 2019-10-16 DIAGNOSIS — E669 Obesity, unspecified: Secondary | ICD-10-CM

## 2019-10-16 DIAGNOSIS — E559 Vitamin D deficiency, unspecified: Secondary | ICD-10-CM

## 2019-10-16 DIAGNOSIS — R7303 Prediabetes: Secondary | ICD-10-CM

## 2019-10-16 DIAGNOSIS — E7849 Other hyperlipidemia: Secondary | ICD-10-CM | POA: Diagnosis not present

## 2019-10-16 DIAGNOSIS — Z6830 Body mass index (BMI) 30.0-30.9, adult: Secondary | ICD-10-CM

## 2019-10-16 MED ORDER — VITAMIN D (ERGOCALCIFEROL) 1.25 MG (50000 UNIT) PO CAPS: 50000.0000 [IU] | ORAL_CAPSULE | ORAL | 0 refills | Status: DC

## 2019-10-17 NOTE — Progress Notes (Signed)
Chief Complaint:   OBESITY PERPETUA ELLING is here to discuss her progress with her obesity treatment plan along with follow-up of her obesity related diagnoses. Kellsie is on the Category 1 Plan and states she is following her eating plan approximately 100% of the time. Ronique states she is walking 30 minutes 5 times per week.  Today's visit was #: 2 Starting weight: 159 lbs Starting date: 10/02/2019 Today's weight: 155 lbs Today's date: 10/16/2019 Total lbs lost to date: 4 Total lbs lost since last in-office visit: 4  Interim History: Maylin is down 4 lbs and doing well. She has trouble at night with hunger about 7:00 to 8:00 p.m.  Subjective:   Other hyperlipidemia. Juliani is on no medications currently. She has tried statins, but had side effects. LDL 207; total cholesterol 274; LDL/HDL ration 4.7.   Lab Results  Component Value Date   CHOL 274 (H) 10/02/2019   HDL 44 10/02/2019   LDLCALC 207 (H) 10/02/2019   TRIG 128 10/02/2019   Lab Results  Component Value Date   ALT 14 10/02/2019   AST 12 10/02/2019   ALKPHOS 94 10/02/2019   BILITOT <0.2 10/02/2019   The 10-year ASCVD risk score Denman George DC Jr., et al., 2013) is: 27.9%   Values used to calculate the score:     Age: 40 years     Sex: Female     Is Non-Hispanic African American: No     Diabetic: No     Tobacco smoker: No     Systolic Blood Pressure: 142 mmHg     Is BP treated: Yes     HDL Cholesterol: 44 mg/dL     Total Cholesterol: 274 mg/dL  Pre-diabetes. Jini has a diagnosis of prediabetes based on her elevated HgA1c and was informed this puts her at greater risk of developing diabetes. She continues to work on diet and exercise to decrease her risk of diabetes. She denies nausea or hypoglycemia. Shannara is on no medication.  Lab Results  Component Value Date   HGBA1C 5.7 (H) 10/02/2019   Lab Results  Component Value Date   INSULIN 17.9 10/02/2019   Vitamin D deficiency. Vici is taking OTC Vitamin  D 2,000.   Ref. Range 10/02/2019 14:23  Vitamin D, 25-Hydroxy Latest Ref Range: 30.0 - 100.0 ng/mL 30.7   Assessment/Plan:   Other hyperlipidemia. Cardiovascular risk and specific lipid/LDL goals reviewed.  We discussed several lifestyle modifications today and Valerie will continue to work on diet, exercise and weight loss efforts. Orders and follow up as documented in patient record. Massiah will avoid trans fats and increase PUFA's and MUFA's.  Counseling Intensive lifestyle modifications are the first line treatment for this issue. . Dietary changes: Increase soluble fiber. Decrease simple carbohydrates. . Exercise changes: Moderate to vigorous-intensity aerobic activity 150 minutes per week if tolerated.  . Lipid-lowering medications: see documented in medical record.  Pre-diabetes. Annalyse will continue to work on weight loss, exercise, increasing healthy fats and protein, and decreasing simple carbohydrates to help decrease the risk of diabetes.   Vitamin D deficiency. Low Vitamin D level contributes to fatigue and are associated with obesity, breast, and colon cancer. She was given a prescription for Vitamin D 50,000 IU every week #4 with 0 refills and will follow-up for routine testing of Vitamin D, at least 2-3 times per year to avoid over-replacement.  Class 1 obesity with serious comorbidity and body mass index (BMI) of 30.0 to 30.9 in adult, unspecified  obesity type - BMI greater than 30 at start.  Dawn is currently in the action stage of change. As such, her goal is to continue with weight loss efforts. She has agreed to the Category 1 Plan.   She will work on meal planning and intentional eating.   We reviewed with the patient labs from 10/02/2019 including CMP, lipids, Vitamin D, CBC, A1c, insulin, and thyroid panel.  Handout was provided on Protein Equivalents.  Exercise goals: Older adults should follow the adult guidelines. When older adults cannot meet the adult  guidelines, they should be as physically active as their abilities and conditions will allow.   Behavioral modification strategies: increasing lean protein intake, decreasing simple carbohydrates, increasing vegetables, increasing water intake, decreasing eating out, no skipping meals, meal planning and cooking strategies, keeping healthy foods in the home and planning for success.  Melodie has agreed to follow-up with our clinic in 2 weeks. She was informed of the importance of frequent follow-up visits to maximize her success with intensive lifestyle modifications for her multiple health conditions.   Objective:   Blood pressure (!) 142/80, pulse 69, temperature 97.7 F (36.5 C), temperature source Oral, height 5\' 1"  (1.549 m), weight 155 lb (70.3 kg), SpO2 93 %. Body mass index is 29.29 kg/m.  General: Cooperative, alert, well developed, in no acute distress. HEENT: Conjunctivae and lids unremarkable. Cardiovascular: Regular rhythm.  Lungs: Normal work of breathing. Neurologic: No focal deficits.   Lab Results  Component Value Date   CREATININE 0.87 10/02/2019   BUN 18 10/02/2019   NA 140 10/02/2019   K 4.3 10/02/2019   CL 102 10/02/2019   CO2 23 10/02/2019   Lab Results  Component Value Date   ALT 14 10/02/2019   AST 12 10/02/2019   ALKPHOS 94 10/02/2019   BILITOT <0.2 10/02/2019   Lab Results  Component Value Date   HGBA1C 5.7 (H) 10/02/2019   Lab Results  Component Value Date   INSULIN 17.9 10/02/2019   Lab Results  Component Value Date   TSH 2.270 10/02/2019   Lab Results  Component Value Date   CHOL 274 (H) 10/02/2019   HDL 44 10/02/2019   LDLCALC 207 (H) 10/02/2019   TRIG 128 10/02/2019   Lab Results  Component Value Date   WBC 6.5 10/02/2019   HGB 13.2 10/02/2019   HCT 39.3 10/02/2019   MCV 84 10/02/2019   PLT 250 10/02/2019   No results found for: IRON, TIBC, FERRITIN  Obesity Behavioral Intervention Documentation for Insurance:    Approximately 15 minutes were spent on the discussion below.  ASK: We discussed the diagnosis of obesity with 10/04/2019 today and Aldora agreed to give [name] permission to discuss obesity behavioral modification therapy today.  ASSESS: Dea has the diagnosis of obesity and her BMI today is 29.4. Glorian is in the action stage of change.   ADVISE: Laretha was educated on the multiple health risks of obesity as well as the benefit of weight loss to improve her health. She was advised of the need for long term treatment and the importance of lifestyle modifications to improve her current health and to decrease her risk of future health problems.  AGREE: Multiple dietary modification options and treatment options were discussed and Lasonia agreed to follow the recommendations documented in the above note.  ARRANGE: Mayleen was educated on the importance of frequent visits to treat obesity as outlined per CMS and USPSTF guidelines and agreed to schedule her next follow up appointment  today.  Attestation Statements:   Reviewed by clinician on day of visit: allergies, medications, problem list, medical history, surgical history, family history, social history, and previous encounter notes.  Fernanda Drum, am acting as Energy manager for [employer], DO   I have reviewed the above documentation for accuracy and completeness, and I agree with the above. Corinna Capra, DO

## 2019-10-31 ENCOUNTER — Ambulatory Visit (INDEPENDENT_AMBULATORY_CARE_PROVIDER_SITE_OTHER): Payer: Medicare Other | Admitting: Bariatrics

## 2019-11-07 ENCOUNTER — Ambulatory Visit (INDEPENDENT_AMBULATORY_CARE_PROVIDER_SITE_OTHER): Payer: Medicare Other | Admitting: Bariatrics

## 2019-11-07 ENCOUNTER — Other Ambulatory Visit: Payer: Self-pay

## 2019-11-07 ENCOUNTER — Encounter (INDEPENDENT_AMBULATORY_CARE_PROVIDER_SITE_OTHER): Payer: Self-pay | Admitting: Bariatrics

## 2019-11-07 VITALS — BP 123/81 | HR 77 | Temp 98.2°F | Ht 61.0 in | Wt 154.0 lb

## 2019-11-07 DIAGNOSIS — E559 Vitamin D deficiency, unspecified: Secondary | ICD-10-CM | POA: Diagnosis not present

## 2019-11-07 DIAGNOSIS — F3289 Other specified depressive episodes: Secondary | ICD-10-CM

## 2019-11-07 DIAGNOSIS — E669 Obesity, unspecified: Secondary | ICD-10-CM

## 2019-11-07 DIAGNOSIS — I10 Essential (primary) hypertension: Secondary | ICD-10-CM | POA: Diagnosis not present

## 2019-11-07 DIAGNOSIS — Z6830 Body mass index (BMI) 30.0-30.9, adult: Secondary | ICD-10-CM

## 2019-11-07 MED ORDER — VITAMIN D (ERGOCALCIFEROL) 1.25 MG (50000 UNIT) PO CAPS: 50000.0000 [IU] | ORAL_CAPSULE | ORAL | 0 refills | Status: DC

## 2019-11-07 NOTE — Progress Notes (Signed)
Chief Complaint:   OBESITY Catherine Arias is here to discuss her progress with her obesity treatment plan along with follow-up of her obesity related diagnoses. Catherine Arias is on the Category 1 Plan and states she is following her eating plan approximately 50% of the time. Catherine Arias states she is walking 30 minutes 4 times per week.  Today's visit was #: 3 Starting weight: 159 lbs Starting date: 10/02/2019 Today's weight: 154 lbs Today's date: 11/07/2019 Total lbs lost to date: 5 Total lbs lost since last in-office visit: 1  Interim History: Catherine Arias is down an additional 1 lb since her last visit. She had a visit from her children and has eaten out a lot. She has had protein with eggs. She reports getting in adequate water.  Subjective:   Vitamin D deficiency. No nausea, vomiting, or muscle weakness.    Ref. Range 10/02/2019 14:23  Vitamin D, 25-Hydroxy Latest Ref Range: 30.0 - 100.0 ng/mL 30.7   Essential hypertension. Catherine Arias is taking HCTZ and Cozaar. Blood pressure is controlled.  BP Readings from Last 3 Encounters:  11/07/19 123/81  10/16/19 (!) 142/80  10/02/19 (!) 140/75   Lab Results  Component Value Date   CREATININE 0.87 10/02/2019   CREATININE 0.76 07/14/2017   CREATININE 0.92 07/09/2017   Other depression, with emotional eating. Catherine Arias is struggling with emotional eating and using food for comfort to the extent that it is negatively impacting her health. She has been working on behavior modification techniques to help reduce her emotional eating and has been somewhat successful. She shows no sign of suicidal or homicidal ideations.  Assessment/Plan:   Vitamin D deficiency. Low Vitamin D level contributes to fatigue and are associated with obesity, breast, and colon cancer. She was given a prescription for Vitamin D @50 ,000 IU every week #4 with 0 refills and will follow-up for routine testing of Vitamin D, at least 2-3 times per year to avoid  over-replacement.  Essential hypertension. Catherine Arias is working on healthy weight loss and exercise to improve blood pressure control. We will watch for signs of hypotension as she continues her lifestyle modifications. She will continue her medications as directed.   Other depression, with emotional eating. Behavior modification techniques were discussed today to help Catherine Arias deal with her emotional/non-hunger eating behaviors.  Orders and follow up as documented in patient record. Handout was provided on "Triggers for Emotional Eating."  Class 1 obesity with serious comorbidity and body mass index (BMI) of 30.0 to 30.9 in adult, unspecified obesity type.  Catherine Arias is currently in the action stage of change. As such, her goal is to continue with weight loss efforts. She has agreed to the Category 1 Plan.   She will work on meal planning, intentional eating, and being more adherent to the meal plan.  Exercise goals: Older adults should follow the adult guidelines. When older adults cannot meet the adult guidelines, they should be as physically active as their abilities and conditions will allow.   Behavioral modification strategies: increasing lean protein intake, decreasing simple carbohydrates, increasing vegetables, increasing water intake, decreasing eating out, no skipping meals, meal planning and cooking strategies, keeping healthy foods in the home and planning for success.  Catherine Arias has agreed to follow-up with our clinic in 2-3 weeks. She was informed of the importance of frequent follow-up visits to maximize her success with intensive lifestyle modifications for her multiple health conditions.   Objective:   Blood pressure 123/81, pulse 77, temperature 98.2 F (36.8 C), height  5\' 1"  (1.549 m), weight 154 lb (69.9 kg), SpO2 91 %. Body mass index is 29.1 kg/m.  General: Cooperative, alert, well developed, in no acute distress. HEENT: Conjunctivae and lids unremarkable. Cardiovascular:  Regular rhythm.  Lungs: Normal work of breathing. Neurologic: No focal deficits.   Lab Results  Component Value Date   CREATININE 0.87 10/02/2019   BUN 18 10/02/2019   NA 140 10/02/2019   K 4.3 10/02/2019   CL 102 10/02/2019   CO2 23 10/02/2019   Lab Results  Component Value Date   ALT 14 10/02/2019   AST 12 10/02/2019   ALKPHOS 94 10/02/2019   BILITOT <0.2 10/02/2019   Lab Results  Component Value Date   HGBA1C 5.7 (H) 10/02/2019   Lab Results  Component Value Date   INSULIN 17.9 10/02/2019   Lab Results  Component Value Date   TSH 2.270 10/02/2019   Lab Results  Component Value Date   CHOL 274 (H) 10/02/2019   HDL 44 10/02/2019   LDLCALC 207 (H) 10/02/2019   TRIG 128 10/02/2019   Lab Results  Component Value Date   WBC 6.5 10/02/2019   HGB 13.2 10/02/2019   HCT 39.3 10/02/2019   MCV 84 10/02/2019   PLT 250 10/02/2019   No results found for: IRON, TIBC, FERRITIN  Obesity Behavioral Intervention Documentation for Insurance:   Approximately 15 minutes were spent on the discussion below.  ASK: We discussed the diagnosis of obesity with 10/04/2019 today and Catherine Arias agreed to give Catherine Arias permission to discuss obesity behavioral modification therapy today.  ASSESS: Catherine Arias has the diagnosis of obesity and her BMI today is 29.2. Catherine Arias is in the action stage of change.   ADVISE: Catherine Arias was educated on the multiple health risks of obesity as well as the benefit of weight loss to improve her health. She was advised of the need for long term treatment and the importance of lifestyle modifications to improve her current health and to decrease her risk of future health problems.  AGREE: Multiple dietary modification options and treatment options were discussed and Catherine Arias agreed to follow the recommendations documented in the above note.  ARRANGE: Catherine Arias was educated on the importance of frequent visits to treat obesity as outlined per CMS and USPSTF guidelines and agreed  to schedule her next follow up appointment today.  Attestation Statements:   Reviewed by clinician on day of visit: allergies, medications, problem list, medical history, surgical history, family history, social history, and previous encounter notes.  Catherine Arias, am acting as Catherine Arias for Energy manager, DO   I have reviewed the above documentation for accuracy and completeness, and I agree with the above. [employer], DO

## 2019-11-28 ENCOUNTER — Ambulatory Visit (INDEPENDENT_AMBULATORY_CARE_PROVIDER_SITE_OTHER): Payer: Medicare Other | Admitting: Bariatrics

## 2019-11-29 ENCOUNTER — Ambulatory Visit
Admission: EM | Admit: 2019-11-29 | Discharge: 2019-11-29 | Disposition: A | Discharge status: Home / Self Care | Payer: Medicare Other | Attending: Emergency Medicine | Admitting: Emergency Medicine

## 2019-11-29 DIAGNOSIS — N390 Urinary tract infection, site not specified: Secondary | ICD-10-CM | POA: Insufficient documentation

## 2019-11-29 LAB — POCT URINALYSIS DIP (MANUAL ENTRY)
Bilirubin, UA: NEGATIVE
Glucose, UA: NEGATIVE mg/dL
Nitrite, UA: POSITIVE — AB
Protein Ur, POC: NEGATIVE mg/dL
Spec Grav, UA: 1.025 (ref 1.010–1.025)
Urobilinogen, UA: 0.2 E.U./dL
pH, UA: 5.5 (ref 5.0–8.0)

## 2019-11-29 MED ORDER — CIPROFLOXACIN HCL 500 MG PO TABS: 500.0000 mg | ORAL_TABLET | Freq: Two times a day (BID) | ORAL | 0 refills | Status: AC

## 2019-11-29 NOTE — ED Triage Notes (Signed)
Pt c/o bladder pressure, pain after urinating, urinary urgency/frequency, and lower back pain since yesterday. States took AZO this am. States saw blood after wiping this am.

## 2019-11-29 NOTE — Discharge Instructions (Signed)
Urine showed evidence of infection. We are treating you with cipro-twice daily for 1 week. Be sure to take full course. Stay hydrated- urine should be pale yellow to clear. May continue azo for relief of burning while infection is being cleared.   Please return or follow up with your primary provider if symptoms not improving with treatment. Please return sooner if you have worsening of symptoms or develop fever, nausea, vomiting, abdominal pain, back pain, lightheadedness, dizziness.

## 2019-11-29 NOTE — ED Provider Notes (Signed)
[name]    CSN: [id number] Arrival date & time: 11/29/19  1436      History   Chief Complaint Chief Complaint  Patient presents with  . Urinary Tract Infection    HPI Catherine Arias is a 74 y.o. female presenting today for evaluation of possible UTI.  Patient reports that over the past 1 to 2 days she has developed dysuria, lower abdominal pain/pressure, urinary urgency frequency and some lower back pain.  Denies any nausea vomiting or fevers.  Did notice some hematuria this morning.  Reports history of prior UTIs.  Symptoms feel similar.  Reports previously doing well with Cipro.  HPI  Past Medical History:  Diagnosis Date  . Arthritis   . Back pain   . Fibromyalgia   . GERD (gastroesophageal reflux disease)   . Hiatal hernia   . High cholesterol   . Hypertension   . Joint pain   . OAB (overactive bladder)   . Pneumonia   . PONV (postoperative nausea and vomiting)     Patient Active Problem List   Diagnosis Date Noted  . Prediabetes 10/03/2019  . Class 1 obesity due to excess calories with body mass index (BMI) of 30.0 to 30.9 in adult 10/02/2019  . High blood pressure   . Vaginal vault prolapse 02/01/2019  . Recurrent UTI 12/28/2018  . Hyperlipidemia 02/18/2018  . Cystocele with prolapse 07/13/2017  . HTN (hypertension) 09/25/2014  . CONSTIPATION 05/27/2009  . PERSONAL HX COLONIC POLYPS 05/27/2009    Past Surgical History:  Procedure Laterality Date  . ABDOMINAL HYSTERECTOMY    . ANTERIOR AND POSTERIOR REPAIR N/A 07/13/2017   Procedure: ANTERIOR (CYSTOCELE) AND POSTERIOR REPAIR (RECTOCELE);  Surgeon: Alfredo Martinez, MD;  Location: [facility];  Service: Urology;  Laterality: N/A;  . APPENDECTOMY    . CYSTOSCOPY N/A 07/13/2017   Procedure: CYSTOSCOPY;  Surgeon: Alfredo Martinez, MD;  Location: [facility];  Service: Urology;  Laterality: N/A;  . INNER EAR SURGERY     2014  . repair cystocele redctocele vault prolapse graft cystoscopy     07-13-17  Dr. Sherron Monday    OB History    Gravida  2   Para  2   Term      Preterm      AB      Living        SAB      TAB      Ectopic      Multiple      Live Births               Home Medications    Prior to Admission medications   Medication Sig Start Date End Date Taking? Authorizing Provider  Ascorbic Acid (VITAMIN C) 100 MG tablet Take 100 mg by mouth daily.    [provider]  cholecalciferol (VITAMIN D3) 25 MCG (1000 UNIT) tablet Take 1,000 Units by mouth daily.    [provider]  ciprofloxacin (CIPRO) 500 MG tablet Take 1 tablet (500 mg total) by mouth every 12 (twelve) hours for 7 days. 11/29/19 12/06/19  Tyah Acord C, PA-C  estradiol (ESTRACE) 0.1 MG/GM vaginal cream Place 1 Applicatorful vaginally at bedtime.    [provider]  hydrochlorothiazide (MICROZIDE) 12.5 MG capsule Take 12.5 mg by mouth daily.    [provider]  losartan (COZAAR) 50 MG tablet Take 50 mg by mouth daily.     [provider]  omeprazole (PRILOSEC) 20 MG capsule Take 20 mg  by mouth daily.    [provider]  solifenacin (VESICARE) 10 MG tablet Take 10 mg by mouth daily.    [provider]  Vitamin D, Ergocalciferol, (DRISDOL) 1.25 MG (50000 UNIT) CAPS capsule Take 1 capsule (50,000 Units total) by mouth every 7 (seven) days. 11/07/19   [place] Nickel, DO    Family History Family History  Problem Relation Age of Onset  . High blood pressure Mother   . Diabetes Father   . High Cholesterol Father   . Heart disease Father   . Sudden death Father     Social History Social History   Tobacco Use  . Smoking status: Never Smoker  . Smokeless tobacco: Never Used  Vaping Use  . Vaping Use: Never used  Substance Use Topics  . Alcohol use: No    Alcohol/week: 0.0 standard drinks  . Drug use: No     Allergies   Amoxicillin and Sulfonamide derivatives   Review of Systems Review of Systems  Constitutional:  Negative for fever.  Respiratory: Negative for shortness of breath.   Cardiovascular: Negative for chest pain.  Gastrointestinal: Negative for abdominal pain, diarrhea, nausea and vomiting.  Genitourinary: Positive for dysuria, frequency, hematuria and urgency. Negative for flank pain, genital sores, menstrual problem, vaginal bleeding, vaginal discharge and vaginal pain.  Musculoskeletal: Negative for back pain.  Skin: Negative for rash.  Neurological: Negative for dizziness, light-headedness and headaches.     Physical Exam Triage Vital Signs ED Triage Vitals [11/29/19 1454]  Enc Vitals Group     BP 129/70     Pulse Rate 98     Resp 18     Temp 98.9 F (37.2 C)     Temp Source Oral     SpO2 92 %     Weight      Height      Head Circumference      Peak Flow      Pain Score 0     Pain Loc      Pain Edu?      Excl. in GC?    No data found.  Updated Vital Signs BP 129/70 (BP Location: Left Arm)   Pulse 98   Temp 98.9 F (37.2 C) (Oral)   Resp 18   SpO2 92%   Visual Acuity Right Eye Distance:   Left Eye Distance:   Bilateral Distance:    Right Eye Near:   Left Eye Near:    Bilateral Near:     Physical Exam Vitals and nursing note reviewed.  Constitutional:      Appearance: She is well-developed.     Comments: No acute distress  HENT:     Head: Normocephalic and atraumatic.     Nose: Nose normal.  Eyes:     Conjunctiva/sclera: Conjunctivae normal.  Cardiovascular:     Rate and Rhythm: Normal rate.  Pulmonary:     Effort: Pulmonary effort is normal. No respiratory distress.  Abdominal:     General: There is no distension.     Comments: Soft, nondistended, tender to palpation to bilateral lower quadrants and suprapubic area, negative rebound Negative CVA tenderness bilaterally  Musculoskeletal:        General: Normal range of motion.     Cervical back: Neck supple.  Skin:    General: Skin is warm and dry.  Neurological:     Mental Status: She is  alert and oriented to person, place, and time.      UC  Treatments / Results  Labs (all labs ordered are listed, but only abnormal results are displayed) Labs Reviewed  POCT URINALYSIS DIP (MANUAL ENTRY) - Abnormal; Notable for the following components:      Result Value   Ketones, POC UA trace (5) (*)    Blood, UA trace-intact (*)    Nitrite, UA Positive (*)    Leukocytes, UA Trace (*)    All other components within normal limits  URINE CULTURE    EKG   Radiology No results found.  Procedures Procedures (including critical care time)  Medications Ordered in UC Medications - No data to display  Initial Impression / Assessment and Plan / UC Course  I have reviewed the triage vital signs and the nursing notes.  Pertinent labs & imaging results that were available during my care of the patient were reviewed by me and considered in my medical decision making (see chart for details).     Trace leuks, positive nitrites, treating for UTI with Cipro twice daily x1 week.  Urine culture pending.  Will call with results and alter therapy as needed.  Push fluids.  Vital signs stable, do not suspect pyelonephritis at this time.  Discussed strict return precautions. Patient verbalized understanding and is agreeable with plan.  Final Clinical Impressions(s) / UC Diagnoses   Final diagnoses:  Lower urinary tract infection, acute     Discharge Instructions     Urine showed evidence of infection. We are treating you with cipro-twice daily for 1 week. Be sure to take full course. Stay hydrated- urine should be pale yellow to clear. May continue azo for relief of burning while infection is being cleared.   Please return or follow up with your primary provider if symptoms not improving with treatment. Please return sooner if you have worsening of symptoms or develop fever, nausea, vomiting, abdominal pain, back pain, lightheadedness, dizziness.    ED Prescriptions    Medication  Sig Dispense Auth. Provider   ciprofloxacin (CIPRO) 500 MG tablet Take 1 tablet (500 mg total) by mouth every 12 (twelve) hours for 7 days. 14 tablet Ellawyn Wogan, [place] C, PA-C     PDMP not reviewed this encounter.   Lew Dawes, [place] 11/29/19 1551

## 2019-12-01 ENCOUNTER — Telehealth (HOSPITAL_COMMUNITY): Payer: Self-pay | Admitting: Emergency Medicine

## 2019-12-01 LAB — URINE CULTURE: Culture: >=100000 — AB

## 2019-12-01 MED ORDER — CEPHALEXIN 500 MG PO CAPS: 500.0000 mg | ORAL_CAPSULE | Freq: Two times a day (BID) | ORAL | 0 refills | Status: AC

## 2019-12-05 ENCOUNTER — Ambulatory Visit (INDEPENDENT_AMBULATORY_CARE_PROVIDER_SITE_OTHER): Payer: Medicare Other | Admitting: Bariatrics

## 2019-12-05 ENCOUNTER — Encounter (INDEPENDENT_AMBULATORY_CARE_PROVIDER_SITE_OTHER): Payer: Self-pay | Admitting: Bariatrics

## 2019-12-05 ENCOUNTER — Other Ambulatory Visit: Payer: Self-pay

## 2019-12-05 VITALS — BP 133/72 | HR 65 | Temp 98.0°F | Ht 61.0 in | Wt 152.0 lb

## 2019-12-05 DIAGNOSIS — E669 Obesity, unspecified: Secondary | ICD-10-CM

## 2019-12-05 DIAGNOSIS — R7303 Prediabetes: Secondary | ICD-10-CM | POA: Diagnosis not present

## 2019-12-05 DIAGNOSIS — Z6830 Body mass index (BMI) 30.0-30.9, adult: Secondary | ICD-10-CM

## 2019-12-05 DIAGNOSIS — E559 Vitamin D deficiency, unspecified: Secondary | ICD-10-CM | POA: Diagnosis not present

## 2019-12-05 MED ORDER — VITAMIN D (ERGOCALCIFEROL) 1.25 MG (50000 UNIT) PO CAPS: 50000.0000 [IU] | ORAL_CAPSULE | ORAL | 0 refills | Status: DC

## 2019-12-06 NOTE — Progress Notes (Signed)
Chief Complaint:   OBESITY Catherine Arias is here to discuss her progress with her obesity treatment plan along with follow-up of her obesity related diagnoses. Catherine Arias is on the Category 2 Plan and states she is following her eating plan approximately 80% of the time. Catherine Arias states she is walking for 45 minutes 5 times per week.  Today's visit was #: 4 Starting weight: 159 lbs Starting date: 10/02/2019 Today's weight: 152 lbs Today's date: 12/05/2019 Total lbs lost to date: 7 lbs Total lbs lost since last in-office visit: 2 lbs  Interim History: Catherine Arias is down an additional 2 pounds since her last visit.  She has done well overall.  Subjective:   1. Vitamin D deficiency Catherine Arias's Vitamin D level was 30.7 on 10/02/2019. She is currently taking prescription vitamin D 50,000 IU each week. She denies nausea, vomiting or muscle weakness.    2. Pre-diabetes Catherine Arias has a diagnosis of prediabetes based on her elevated HgA1c and was informed this puts her at greater risk of developing diabetes. She continues to work on diet and exercise to decrease her risk of diabetes. She denies nausea or hypoglycemia.  She is not on any medications.  Lab Results  Component Value Date   HGBA1C 5.7 (H) 10/02/2019   Lab Results  Component Value Date   INSULIN 17.9 10/02/2019   Assessment/Plan:   1. Vitamin D deficiency Low Vitamin D level contributes to fatigue and are associated with obesity, breast, and colon cancer. She agrees to continue to take prescription Vitamin D @50 ,000 IU every week and will follow-up for routine testing of Vitamin D, at least 2-3 times per year to avoid over-replacement.  2. Pre-diabetes Catherine Arias will continue to work on weight loss, exercise, and decreasing simple carbohydrates to help decrease the risk of diabetes.  Continue to work on diet and exercise.  3. Class 1 obesity with serious comorbidity and body mass index (BMI) of 30.0 to 30.9 in adult, unspecified obesity  type  Catherine Arias is currently in the action stage of change. As such, her goal is to continue with weight loss efforts. She has agreed to the Category 2 Plan.   She will work on meal planning and mindful eating.  Exercise goals: Walking (DVD) in the morning and outside.  Behavioral modification strategies: increasing lean protein intake, decreasing simple carbohydrates, increasing vegetables, increasing water intake, decreasing eating out, no skipping meals, meal planning and cooking strategies, keeping healthy foods in the home and planning for success.  Catherine Arias has agreed to follow-up with our clinic in 2 weeks. She was informed of the importance of frequent follow-up visits to maximize her success with intensive lifestyle modifications for her multiple health conditions.   Objective:   Blood pressure 133/72, pulse 65, temperature 98 F (36.7 C), height 5\' 1"  (1.549 m), weight 152 lb (68.9 kg), SpO2 94 %. Body mass index is 28.72 kg/m.  General: Cooperative, alert, well developed, in no acute distress. HEENT: Conjunctivae and lids unremarkable. Cardiovascular: Regular rhythm.  Lungs: Normal work of breathing. Neurologic: No focal deficits.   Lab Results  Component Value Date   CREATININE 0.87 10/02/2019   BUN 18 10/02/2019   NA 140 10/02/2019   K 4.3 10/02/2019   CL 102 10/02/2019   CO2 23 10/02/2019   Lab Results  Component Value Date   ALT 14 10/02/2019   AST 12 10/02/2019   ALKPHOS 94 10/02/2019   BILITOT <0.2 10/02/2019   Lab Results  Component Value Date  HGBA1C 5.7 (H) 10/02/2019   Lab Results  Component Value Date   INSULIN 17.9 10/02/2019   Lab Results  Component Value Date   TSH 2.270 10/02/2019   Lab Results  Component Value Date   CHOL 274 (H) 10/02/2019   HDL 44 10/02/2019   LDLCALC 207 (H) 10/02/2019   TRIG 128 10/02/2019   Lab Results  Component Value Date   WBC 6.5 10/02/2019   HGB 13.2 10/02/2019   HCT 39.3 10/02/2019   MCV 84 10/02/2019    PLT 250 10/02/2019   Obesity Behavioral Intervention:   Approximately 15 minutes were spent on the discussion below.  ASK: We discussed the diagnosis of obesity with Catherine Arias today and Catherine Arias agreed to give [place] permission to discuss obesity behavioral modification therapy today.  ASSESS: Catherine Arias has the diagnosis of obesity and her BMI today is 28.7. Catherine Arias is in the action stage of change.   ADVISE: Catherine Arias was educated on the multiple health risks of obesity as well as the benefit of weight loss to improve her health. She was advised of the need for long term treatment and the importance of lifestyle modifications to improve her current health and to decrease her risk of future health problems.  AGREE: Multiple dietary modification options and treatment options were discussed and Catherine Arias agreed to follow the recommendations documented in the above note.  ARRANGE: Catherine Arias was educated on the importance of frequent visits to treat obesity as outlined per CMS and USPSTF guidelines and agreed to schedule her next follow up appointment today.  Attestation Statements:   Reviewed by clinician on day of visit: allergies, medications, problem list, medical history, surgical history, family history, social history, and previous encounter notes.  I, Insurance claims handler, CMA, am acting as Energy manager for [employer], DO  I have reviewed the above documentation for accuracy and completeness, and I agree with the above. Corinna Capra, DO

## 2019-12-07 ENCOUNTER — Encounter (INDEPENDENT_AMBULATORY_CARE_PROVIDER_SITE_OTHER): Payer: Self-pay | Admitting: Bariatrics

## 2019-12-28 ENCOUNTER — Ambulatory Visit (INDEPENDENT_AMBULATORY_CARE_PROVIDER_SITE_OTHER): Payer: Medicare Other | Admitting: Bariatrics

## 2020-01-09 ENCOUNTER — Encounter (INDEPENDENT_AMBULATORY_CARE_PROVIDER_SITE_OTHER): Payer: Self-pay | Admitting: Bariatrics

## 2020-01-09 ENCOUNTER — Other Ambulatory Visit: Payer: Self-pay

## 2020-01-09 ENCOUNTER — Ambulatory Visit (INDEPENDENT_AMBULATORY_CARE_PROVIDER_SITE_OTHER): Payer: Medicare Other | Admitting: Bariatrics

## 2020-01-09 VITALS — BP 137/76 | HR 62 | Temp 98.4°F | Ht 61.0 in | Wt 150.0 lb

## 2020-01-09 DIAGNOSIS — E669 Obesity, unspecified: Secondary | ICD-10-CM | POA: Diagnosis not present

## 2020-01-09 DIAGNOSIS — Z6830 Body mass index (BMI) 30.0-30.9, adult: Secondary | ICD-10-CM | POA: Diagnosis not present

## 2020-01-09 DIAGNOSIS — E559 Vitamin D deficiency, unspecified: Secondary | ICD-10-CM

## 2020-01-09 DIAGNOSIS — I10 Essential (primary) hypertension: Secondary | ICD-10-CM

## 2020-01-09 MED ORDER — VITAMIN D (ERGOCALCIFEROL) 1.25 MG (50000 UNIT) PO CAPS: 50000.0000 [IU] | ORAL_CAPSULE | ORAL | 0 refills | Status: AC

## 2020-01-10 ENCOUNTER — Encounter (INDEPENDENT_AMBULATORY_CARE_PROVIDER_SITE_OTHER): Payer: Self-pay | Admitting: Bariatrics

## 2020-01-10 NOTE — Progress Notes (Signed)
Chief Complaint:   OBESITY [employer] is here to discuss her progress with her obesity treatment plan along with follow-up of her obesity related diagnoses. Shalina is on the Category 2 Plan and states she is following her eating plan approximately 80% of the time. Aquanetta states she is walking 30 minutes 4 times per week and doing aerobics 30 minutes 4 times per week.  Today's visit was #: 5 Starting weight: 159 lbs Starting date: 10/02/2019 Today's weight: 150 lbs Today's date: 01/09/2020 Total lbs lost to date: 9 Total lbs lost since last in-office visit: 2  Interim History: Mykenna is down 2 lbs. She reports having less pain with walking. She is not eating any desserts and is making better choices.  Subjective:   Vitamin D deficiency. Shelsey reports minimal sun exposure.   Ref. Range 10/02/2019 14:23  Vitamin D, 25-Hydroxy Latest Ref Range: 30.0 - 100.0 ng/mL 30.7   Essential hypertension. Blood pressure is controlled. Aliveah reports taking medications as advised.  BP Readings from Last 3 Encounters:  01/09/20 137/76  12/05/19 133/72  11/29/19 129/70   Lab Results  Component Value Date   CREATININE 0.87 10/02/2019   CREATININE 0.76 07/14/2017   CREATININE 0.92 07/09/2017   Assessment/Plan:   Vitamin D deficiency. Low Vitamin D level contributes to fatigue and are associated with obesity, breast, and colon cancer. She was given a prescription for Vitamin D, Ergocalciferol, (DRISDOL) 1.25 MG (50000 UNIT) CAPS capsule every week #4 with 0 refills and will follow-up for routine testing of Vitamin D, at least 2-3 times per year to avoid over-replacement.   Essential hypertension. Jordana is working on healthy weight loss and exercise to improve blood pressure control. We will watch for signs of hypotension as she continues her lifestyle modifications. She will continue her medications as directed.   Class 1 obesity with serious comorbidity and body mass index (BMI) of  30.0 to 30.9 in adult, unspecified obesity type - BMI greater than 30 at start.  Carlea is currently in the action stage of change. As such, her goal is to continue with weight loss efforts. She has agreed to the Category 2 Plan.   She will work on meal planning, being mindful of her eating and process what she is eating, and making good choices.  We discussed strategies for the holidays.  Handout was provided on Recipes.  Exercise goals: Older adults should follow the adult guidelines. When older adults cannot meet the adult guidelines, they should be as physically active as their abilities and conditions will allow.   Behavioral modification strategies: increasing lean protein intake, decreasing simple carbohydrates, increasing vegetables, increasing water intake, decreasing eating out, no skipping meals, meal planning and cooking strategies, keeping healthy foods in the home and planning for success.  Juelle has agreed to follow-up with our clinic in 2-3 weeks. She was informed of the importance of frequent follow-up visits to maximize her success with intensive lifestyle modifications for her multiple health conditions.   Objective:   Blood pressure 137/76, pulse 62, temperature 98.4 F (36.9 C), height 5\' 1"  (1.549 m), weight 150 lb (68 kg), SpO2 96 %. Body mass index is 28.34 kg/m.  General: Cooperative, alert, well developed, in no acute distress. HEENT: Conjunctivae and lids unremarkable. Cardiovascular: Regular rhythm.  Lungs: Normal work of breathing. Neurologic: No focal deficits.   Lab Results  Component Value Date   CREATININE 0.87 10/02/2019   BUN 18 10/02/2019   NA 140 10/02/2019  K 4.3 10/02/2019   CL 102 10/02/2019   CO2 23 10/02/2019   Lab Results  Component Value Date   ALT 14 10/02/2019   AST 12 10/02/2019   ALKPHOS 94 10/02/2019   BILITOT <0.2 10/02/2019   Lab Results  Component Value Date   HGBA1C 5.7 (H) 10/02/2019   Lab Results  Component  Value Date   INSULIN 17.9 10/02/2019   Lab Results  Component Value Date   TSH 2.270 10/02/2019   Lab Results  Component Value Date   CHOL 274 (H) 10/02/2019   HDL 44 10/02/2019   LDLCALC 207 (H) 10/02/2019   TRIG 128 10/02/2019   Lab Results  Component Value Date   WBC 6.5 10/02/2019   HGB 13.2 10/02/2019   HCT 39.3 10/02/2019   MCV 84 10/02/2019   PLT 250 10/02/2019   No results found for: IRON, TIBC, FERRITIN  Obesity Behavioral Intervention:   Approximately 15 minutes were spent on the discussion below.  ASK: We discussed the diagnosis of obesity with [name] today and Tomeshia agreed to give [place] permission to discuss obesity behavioral modification therapy today.  ASSESS: Kamillah has the diagnosis of obesity and her BMI today is 28.5. Omaira is in the action stage of change.   ADVISE: Gertrue was educated on the multiple health risks of obesity as well as the benefit of weight loss to improve her health. She was advised of the need for long term treatment and the importance of lifestyle modifications to improve her current health and to decrease her risk of future health problems.  AGREE: Multiple dietary modification options and treatment options were discussed and Kurstyn agreed to follow the recommendations documented in the above note.  ARRANGE: Amarilys was educated on the importance of frequent visits to treat obesity as outlined per CMS and USPSTF guidelines and agreed to schedule her next follow up appointment today.  Attestation Statements:   Reviewed by clinician on day of visit: allergies, medications, problem list, medical history, surgical history, family history, social history, and previous encounter notes.  Fernanda Drum, am acting as Energy manager for [employer], DO   I have reviewed the above documentation for accuracy and completeness, and I agree with the above. Corinna Capra, DO

## 2020-01-30 ENCOUNTER — Ambulatory Visit (INDEPENDENT_AMBULATORY_CARE_PROVIDER_SITE_OTHER): Payer: Medicare Other | Admitting: Bariatrics

## 2020-02-02 ENCOUNTER — Ambulatory Visit
Admission: EM | Admit: 2020-02-02 | Discharge: 2020-02-02 | Disposition: A | Discharge status: Home / Self Care | Payer: Medicare Other | Attending: Emergency Medicine | Admitting: Emergency Medicine

## 2020-02-02 ENCOUNTER — Other Ambulatory Visit: Payer: Self-pay

## 2020-02-02 DIAGNOSIS — N3001 Acute cystitis with hematuria: Secondary | ICD-10-CM | POA: Diagnosis not present

## 2020-02-02 LAB — POCT URINALYSIS DIP (MANUAL ENTRY)
Bilirubin, UA: NEGATIVE
Glucose, UA: NEGATIVE mg/dL
Ketones, POC UA: NEGATIVE mg/dL
Nitrite, UA: POSITIVE — AB
Protein Ur, POC: NEGATIVE mg/dL
Spec Grav, UA: 1.02 (ref 1.010–1.025)
Urobilinogen, UA: 0.2 E.U./dL
pH, UA: 5.5 (ref 5.0–8.0)

## 2020-02-02 MED ORDER — CEPHALEXIN 500 MG PO CAPS: 500.0000 mg | ORAL_CAPSULE | Freq: Two times a day (BID) | ORAL | 0 refills | Status: AC

## 2020-02-02 NOTE — Discharge Instructions (Addendum)
Take antibiotic twice daily with food. Important to drink plenty of water throughout the day. Return for worsening urinary symptoms, blood in urine, abdominal or back pain, fever. 

## 2020-02-02 NOTE — ED Triage Notes (Signed)
Pt present lower back pain with frequency urination. Symptoms started in the middle of the night. Pt states when going to the bathroom she is having burning sensation.

## 2020-02-02 NOTE — ED Provider Notes (Signed)
[name]    CSN: [id number] Arrival date & time: 02/02/20  1530      History   Chief Complaint Chief Complaint  Patient presents with  . Urinary Tract Infection    HPI Catherine Arias is a 74 y.o. female  With extensive medical history as below presenting for possible UTI.  Endorsing low back pain with urinary frequency and dysuria since last night.  Patient followed by urology, though unable to get appointment with them.  Denies vaginal discharge, nausea or vomiting, fever.  Past Medical History:  Diagnosis Date  . Arthritis   . Back pain   . Fibromyalgia   . GERD (gastroesophageal reflux disease)   . Hiatal hernia   . High cholesterol   . Hypertension   . Joint pain   . OAB (overactive bladder)   . Pneumonia   . PONV (postoperative nausea and vomiting)     Patient Active Problem List   Diagnosis Date Noted  . Prediabetes 10/03/2019  . Class 1 obesity due to excess calories with body mass index (BMI) of 30.0 to 30.9 in adult 10/02/2019  . High blood pressure   . Vaginal vault prolapse 02/01/2019  . Recurrent UTI 12/28/2018  . Hyperlipidemia 02/18/2018  . Cystocele with prolapse 07/13/2017  . HTN (hypertension) 09/25/2014  . CONSTIPATION 05/27/2009  . PERSONAL HX COLONIC POLYPS 05/27/2009    Past Surgical History:  Procedure Laterality Date  . ABDOMINAL HYSTERECTOMY    . ANTERIOR AND POSTERIOR REPAIR N/A 07/13/2017   Procedure: ANTERIOR (CYSTOCELE) AND POSTERIOR REPAIR (RECTOCELE);  Surgeon: Alfredo Martinez, MD;  Location: [facility];  Service: Urology;  Laterality: N/A;  . APPENDECTOMY    . CYSTOSCOPY N/A 07/13/2017   Procedure: CYSTOSCOPY;  Surgeon: Alfredo Martinez, MD;  Location: [facility];  Service: Urology;  Laterality: N/A;  . INNER EAR SURGERY     2014  . repair cystocele redctocele vault prolapse graft cystoscopy     07-13-17 Dr. Sherron Monday    OB History    Gravida  2   Para  2   Term      Preterm      AB      Living         SAB      TAB      Ectopic      Multiple      Live Births               Home Medications    Prior to Admission medications   Medication Sig Start Date End Date Taking? Authorizing Provider  Ascorbic Acid (VITAMIN C) 100 MG tablet Take 100 mg by mouth daily.    [provider]  cephALEXin (KEFLEX) 500 MG capsule Take 1 capsule (500 mg total) by mouth 2 (two) times daily for 5 days. 02/02/20 02/07/20  Hall-Potvin, [place], PA-C  estradiol (ESTRACE) 0.1 MG/GM vaginal cream Place 1 Applicatorful vaginally at bedtime.    [provider]  hydrochlorothiazide (MICROZIDE) 12.5 MG capsule Take 12.5 mg by mouth daily.    [provider]  losartan (COZAAR) 50 MG tablet Take 50 mg by mouth daily.     [provider]  omeprazole (PRILOSEC) 20 MG capsule Take 20 mg by mouth daily.    [provider]  solifenacin (VESICARE) 10 MG tablet Take 10 mg by mouth daily.    [provider]  Vitamin D, Ergocalciferol, (DRISDOL) 1.25 MG (50000 UNIT) CAPS capsule Take 1 capsule (50,000 Units total)  by mouth every 7 (seven) days. 01/09/20   Roswell Nickel, DO    Family History Family History  Problem Relation Age of Onset  . High blood pressure Mother   . Diabetes Father   . High Cholesterol Father   . Heart disease Father   . Sudden death Father     Social History Social History   Tobacco Use  . Smoking status: Never Smoker  . Smokeless tobacco: Never Used  Vaping Use  . Vaping Use: Never used  Substance Use Topics  . Alcohol use: No    Alcohol/week: 0.0 standard drinks  . Drug use: No     Allergies   Amoxicillin and Sulfonamide derivatives   Review of Systems Review of Systems  Constitutional: Negative for fatigue and fever.  Respiratory: Negative for cough and shortness of breath.   Cardiovascular: Negative for chest pain and palpitations.  Gastrointestinal: Negative for constipation and diarrhea.  Genitourinary: Positive  for dysuria, frequency and urgency. Negative for flank pain, hematuria, pelvic pain, vaginal bleeding, vaginal discharge and vaginal pain.     Physical Exam Triage Vital Signs ED Triage Vitals  Enc Vitals Group     BP 02/02/20 1552 112/66     Pulse Rate 02/02/20 1552 85     Resp 02/02/20 1552 18     Temp 02/02/20 1552 98.1 F (36.7 C)     Temp Source 02/02/20 1552 Oral     SpO2 02/02/20 1552 92 %     Weight --      Height --      Head Circumference --      Peak Flow --      Pain Score 02/02/20 1550 8     Pain Loc --      Pain Edu? --      Excl. in GC? --    No data found.  Updated Vital Signs BP 112/66 (BP Location: Left Arm)   Pulse 85   Temp 98.1 F (36.7 C) (Oral)   Resp 18   SpO2 92%   Visual Acuity Right Eye Distance:   Left Eye Distance:   Bilateral Distance:    Right Eye Near:   Left Eye Near:    Bilateral Near:     Physical Exam Constitutional:      General: She is not in acute distress. HENT:     Head: Normocephalic and atraumatic.  Eyes:     General: No scleral icterus.    Pupils: Pupils are equal, round, and reactive to light.  Cardiovascular:     Rate and Rhythm: Normal rate.  Pulmonary:     Effort: Pulmonary effort is normal.  Abdominal:     General: Bowel sounds are normal.     Palpations: Abdomen is soft.     Tenderness: There is no abdominal tenderness. There is no right CVA tenderness, left CVA tenderness or guarding.  Skin:    Coloration: Skin is not jaundiced or pale.  Neurological:     Mental Status: She is alert and oriented to person, place, and time.      UC Treatments / Results  Labs (all labs ordered are listed, but only abnormal results are displayed) Labs Reviewed  POCT URINALYSIS DIP (MANUAL ENTRY) - Abnormal; Notable for the following components:      Result Value   Color, UA orange (*)    Blood, UA trace-intact (*)    Nitrite, UA Positive (*)    Leukocytes, UA Trace (*)  All other components within normal  limits  URINE CULTURE    EKG   Radiology No results found.  Procedures Procedures (including critical care time)  Medications Ordered in UC Medications - No data to display  Initial Impression / Assessment and Plan / UC Course  I have reviewed the triage vital signs and the nursing notes.  Pertinent labs & imaging results that were available during my care of the patient were reviewed by me and considered in my medical decision making (see chart for details).     Urine as above, culture pending.  Per chart review, patient's last urine culture from 11/29/19: E. coli resistant to ampicillin, ciprofloxacin, nitrofurantoin, Bactrim.  Sensitive to cephalosporins: We will start Keflex, which patient tolerated well, follow-up with PCP/urology.  Return precautions discussed, pt verbalized understanding and is agreeable to plan. Final Clinical Impressions(s) / UC Diagnoses   Final diagnoses:  Acute cystitis with hematuria     Discharge Instructions     Take antibiotic twice daily with food. Important to drink plenty of water throughout the day. Return for worsening urinary symptoms, blood in urine, abdominal or back pain, fever.    ED Prescriptions    Medication Sig Dispense Auth. Provider   cephALEXin (KEFLEX) 500 MG capsule Take 1 capsule (500 mg total) by mouth 2 (two) times daily for 5 days. 10 capsule Hall-Potvin, [place], PA-C     PDMP not reviewed this encounter.   Hall-Potvin, [place], [place] 02/02/20 1718

## 2020-02-07 LAB — URINE CULTURE: Culture: >=100000 — AB

## 2020-04-03 ENCOUNTER — Ambulatory Visit
Admission: EM | Admit: 2020-04-03 | Discharge: 2020-04-03 | Disposition: A | Discharge status: Home / Self Care | Payer: Medicare Other | Attending: Urgent Care | Admitting: Urgent Care

## 2020-04-03 ENCOUNTER — Ambulatory Visit (INDEPENDENT_AMBULATORY_CARE_PROVIDER_SITE_OTHER): Payer: Medicare Other

## 2020-04-03 ENCOUNTER — Other Ambulatory Visit: Payer: Self-pay

## 2020-04-03 DIAGNOSIS — J020 Streptococcal pharyngitis: Secondary | ICD-10-CM | POA: Diagnosis not present

## 2020-04-03 DIAGNOSIS — J189 Pneumonia, unspecified organism: Secondary | ICD-10-CM | POA: Diagnosis present

## 2020-04-03 DIAGNOSIS — Z20822 Contact with and (suspected) exposure to covid-19: Secondary | ICD-10-CM | POA: Diagnosis not present

## 2020-04-03 DIAGNOSIS — R059 Cough, unspecified: Secondary | ICD-10-CM

## 2020-04-03 DIAGNOSIS — J9811 Atelectasis: Secondary | ICD-10-CM | POA: Diagnosis not present

## 2020-04-03 LAB — POCT RAPID STREP A (OFFICE): Rapid Strep A Screen: NEGATIVE

## 2020-04-03 MED ORDER — PROMETHAZINE-DM 6.25-15 MG/5ML PO SYRP: 5.0000 mL | ORAL_SOLUTION | Freq: Every evening | ORAL | 0 refills | Status: DC | PRN

## 2020-04-03 MED ORDER — BENZONATATE 100 MG PO CAPS
100.0000 mg | ORAL_CAPSULE | Freq: Three times a day (TID) | ORAL | 0 refills | Status: AC | PRN
Start: 2020-04-03 — End: ?

## 2020-04-03 MED ORDER — CEFDINIR 300 MG PO CAPS: 300.0000 mg | ORAL_CAPSULE | Freq: Two times a day (BID) | ORAL | 0 refills | Status: DC

## 2020-04-03 MED ORDER — AZITHROMYCIN 250 MG PO TABS: ORAL_TABLET | ORAL | 0 refills | Status: DC

## 2020-04-03 NOTE — ED Provider Notes (Signed)
[facility]   MRN: [id number] DOB: 1945/11/05  Subjective:   Catherine Arias is a 75 y.o. female presenting for 8 - 10 day history of acute onset persistent and worsening cough, chest congestion, malaise and fatigue. Has had COVID vaccination. No history of lung disorders. No smoking.   No current facility-administered medications for this encounter.  Current Outpatient Medications:  .  Ascorbic Acid (VITAMIN C) 100 MG tablet, Take 100 mg by mouth daily., Disp: , Rfl:  .  estradiol (ESTRACE) 0.1 MG/GM vaginal cream, Place 1 Applicatorful vaginally at bedtime., Disp: , Rfl:  .  hydrochlorothiazide (MICROZIDE) 12.5 MG capsule, Take 12.5 mg by mouth daily., Disp: , Rfl:  .  losartan (COZAAR) 50 MG tablet, Take 50 mg by mouth daily. , Disp: , Rfl:  .  omeprazole (PRILOSEC) 20 MG capsule, Take 20 mg by mouth daily., Disp: , Rfl:  .  solifenacin (VESICARE) 10 MG tablet, Take 10 mg by mouth daily., Disp: , Rfl:  .  Vitamin D, Ergocalciferol, (DRISDOL) 1.25 MG (50000 UNIT) CAPS capsule, Take 1 capsule (50,000 Units total) by mouth every 7 (seven) days., Disp: 4 capsule, Rfl: 0   Allergies  Allergen Reactions  . Sulfa Antibiotics Hives  . Amoxicillin Hives and Other (See Comments)    Has patient had a PCN reaction causing immediate rash, facial/tongue/throat swelling, SOB or lightheadedness with hypotension: Yes Has patient had a PCN reaction causing severe rash involving mucus membranes or skin necrosis: No Has patient had a PCN reaction that required hospitalization: No Has patient had a PCN reaction occurring within the last 10 years: No If all of the above answers are "NO", then may proceed with Cephalosporin use.   . Sulfonamide Derivatives Hives  . Penicillins Rash    Pt states can take Keflex    Past Medical History:  Diagnosis Date  . Arthritis   . Back pain   . Fibromyalgia   . GERD (gastroesophageal reflux disease)   . Hiatal hernia   . High cholesterol   .  Hypertension   . Joint pain   . OAB (overactive bladder)   . Pneumonia   . PONV (postoperative nausea and vomiting)      Past Surgical History:  Procedure Laterality Date  . ABDOMINAL HYSTERECTOMY    . ANTERIOR AND POSTERIOR REPAIR N/A 07/13/2017   Procedure: ANTERIOR (CYSTOCELE) AND POSTERIOR REPAIR (RECTOCELE);  Surgeon: Alfredo Martinez, MD;  Location: [facility];  Service: Urology;  Laterality: N/A;  . APPENDECTOMY    . CYSTOSCOPY N/A 07/13/2017   Procedure: CYSTOSCOPY;  Surgeon: Alfredo Martinez, MD;  Location: [facility];  Service: Urology;  Laterality: N/A;  . INNER EAR SURGERY     2014  . repair cystocele redctocele vault prolapse graft cystoscopy     07-13-17 Dr. Sherron Monday    Family History  Problem Relation Age of Onset  . High blood pressure Mother   . Diabetes Father   . High Cholesterol Father   . Heart disease Father   . Sudden death Father     Social History   Tobacco Use  . Smoking status: Never Smoker  . Smokeless tobacco: Never Used  Vaping Use  . Vaping Use: Never used  Substance Use Topics  . Alcohol use: No    Alcohol/week: 0.0 standard drinks  . Drug use: No    ROS   Objective:   Vitals: BP (!) 143/68 (BP Location: Left Arm)   Pulse 72   Temp 97.8 F (36.6  C) (Oral)   Resp 16   SpO2 94%   Physical Exam Constitutional:      General: She is not in acute distress.    Appearance: Normal appearance. She is well-developed. She is not ill-appearing, toxic-appearing or diaphoretic.  HENT:     Head: Normocephalic and atraumatic.     Nose: Nose normal.     Mouth/Throat:     Mouth: Mucous membranes are moist.  Eyes:     Extraocular Movements: Extraocular movements intact.     Pupils: Pupils are equal, round, and reactive to light.  Cardiovascular:     Rate and Rhythm: Normal rate and regular rhythm.     Pulses: Normal pulses.     Heart sounds: Normal heart sounds. No murmur heard. No friction rub. No gallop.   Pulmonary:     Effort: Pulmonary  effort is normal. No respiratory distress.     Breath sounds: No stridor. Examination of the right-middle field reveals decreased breath sounds. Examination of the left-middle field reveals decreased breath sounds. Examination of the right-lower field reveals decreased breath sounds. Examination of the left-lower field reveals decreased breath sounds. Decreased breath sounds present. No wheezing, rhonchi or rales.  Skin:    General: Skin is warm and dry.     Findings: No rash.  Neurological:     Mental Status: She is alert and oriented to person, place, and time.  Psychiatric:        Mood and Affect: Mood normal.        Behavior: Behavior normal.        Thought Content: Thought content normal.     DG Chest 2 View  Result Date: 04/03/2020 CLINICAL DATA:  Cough. EXAM: CHEST - 2 VIEW COMPARISON:  PET-CT 05/26/2005.  Chest x-ray 05/10/2003. FINDINGS: Mediastinum and hilar structures normal. Heart size normal. Mild right base atelectasis. Mild right base infiltrate cannot be excluded. No pleural effusion or pneumothorax. Heart size normal. Mild scoliosis thoracic spine concave left. No acute bony abnormality. IMPRESSION: Mild right base atelectasis. Mild right base infiltrate cannot be excluded. Electronically Signed   By: Maisie Fus  Register   On: 04/03/2020 10:21    Results for orders placed or performed during the hospital encounter of 04/03/20 (from the past 24 hour(s))  POCT rapid strep A     Status: None   Collection Time: 04/03/20  9:58 AM  Result Value Ref Range   Rapid Strep A Screen Negative Negative   Assessment and Plan :   PDMP not reviewed this encounter.  1. Community acquired pneumonia of right lower lobe of lung   2. Streptococcal sore throat   3. COVID-19 ruled out by laboratory testing     Recommend starting azithromycin, cefdinir combination as per UpToDate for CAP. COVID testing pending. Use supportive care otherwise. Counseled patient on potential for adverse effects  with medications prescribed/recommended today, ER and return-to-clinic precautions discussed, patient verbalized understanding.    Wallis Bamberg, PA-C 04/03/20 1032

## 2020-04-03 NOTE — ED Triage Notes (Signed)
Pt is here with a cough, sore throat that started last Tuesday, pt here with OTC meds to relieve discomfort.

## 2020-04-05 LAB — NOVEL CORONAVIRUS, NAA: SARS-CoV-2, NAA: DETECTED — AB

## 2020-04-05 LAB — SARS-COV-2, NAA 2 DAY TAT

## 2020-04-06 LAB — CULTURE, GROUP A STREP (THRC)

## 2020-04-23 DIAGNOSIS — H0288A Meibomian gland dysfunction right eye, upper and lower eyelids: Secondary | ICD-10-CM | POA: Diagnosis not present

## 2020-04-23 DIAGNOSIS — H16223 Keratoconjunctivitis sicca, not specified as Sjogren's, bilateral: Secondary | ICD-10-CM | POA: Diagnosis not present

## 2020-04-23 DIAGNOSIS — H0288B Meibomian gland dysfunction left eye, upper and lower eyelids: Secondary | ICD-10-CM | POA: Diagnosis not present

## 2020-04-25 DIAGNOSIS — M85852 Other specified disorders of bone density and structure, left thigh: Secondary | ICD-10-CM | POA: Diagnosis not present

## 2020-04-25 DIAGNOSIS — Z87440 Personal history of urinary (tract) infections: Secondary | ICD-10-CM | POA: Diagnosis not present

## 2020-04-25 DIAGNOSIS — E782 Mixed hyperlipidemia: Secondary | ICD-10-CM | POA: Diagnosis not present

## 2020-04-25 DIAGNOSIS — K219 Gastro-esophageal reflux disease without esophagitis: Secondary | ICD-10-CM | POA: Diagnosis not present

## 2020-04-25 DIAGNOSIS — I10 Essential (primary) hypertension: Secondary | ICD-10-CM | POA: Diagnosis not present

## 2020-04-25 DIAGNOSIS — N1832 Chronic kidney disease, stage 3b: Secondary | ICD-10-CM | POA: Diagnosis not present

## 2020-04-25 DIAGNOSIS — Z8616 Personal history of COVID-19: Secondary | ICD-10-CM | POA: Diagnosis not present

## 2020-04-25 DIAGNOSIS — N3281 Overactive bladder: Secondary | ICD-10-CM | POA: Diagnosis not present

## 2020-04-25 DIAGNOSIS — M797 Fibromyalgia: Secondary | ICD-10-CM | POA: Diagnosis not present

## 2020-05-22 ENCOUNTER — Ambulatory Visit (INDEPENDENT_AMBULATORY_CARE_PROVIDER_SITE_OTHER): Payer: Medicare Other

## 2020-05-22 ENCOUNTER — Other Ambulatory Visit: Payer: Self-pay

## 2020-05-22 ENCOUNTER — Ambulatory Visit
Admission: EM | Admit: 2020-05-22 | Discharge: 2020-05-22 | Disposition: A | Discharge status: Home / Self Care | Payer: Medicare Other | Attending: Family Medicine | Admitting: Family Medicine

## 2020-05-22 DIAGNOSIS — R300 Dysuria: Secondary | ICD-10-CM

## 2020-05-22 DIAGNOSIS — Z8701 Personal history of pneumonia (recurrent): Secondary | ICD-10-CM

## 2020-05-22 DIAGNOSIS — U099 Post covid-19 condition, unspecified: Secondary | ICD-10-CM | POA: Diagnosis not present

## 2020-05-22 DIAGNOSIS — R5383 Other fatigue: Secondary | ICD-10-CM | POA: Diagnosis not present

## 2020-05-22 DIAGNOSIS — R109 Unspecified abdominal pain: Secondary | ICD-10-CM | POA: Diagnosis not present

## 2020-05-22 DIAGNOSIS — R10A0 Flank pain, unspecified side: Secondary | ICD-10-CM

## 2020-05-22 DIAGNOSIS — Z8616 Personal history of COVID-19: Secondary | ICD-10-CM | POA: Diagnosis not present

## 2020-05-22 LAB — POCT URINALYSIS DIP (MANUAL ENTRY)
Bilirubin, UA: NEGATIVE
Blood, UA: NEGATIVE
Glucose, UA: NEGATIVE mg/dL
Ketones, POC UA: NEGATIVE mg/dL
Leukocytes, UA: NEGATIVE
Nitrite, UA: NEGATIVE
Protein Ur, POC: NEGATIVE mg/dL
Spec Grav, UA: 1.01 (ref 1.010–1.025)
Urobilinogen, UA: 0.2 E.U./dL
pH, UA: 5.5 (ref 5.0–8.0)

## 2020-05-22 MED ORDER — CIPROFLOXACIN HCL 500 MG PO TABS: 500.0000 mg | ORAL_TABLET | Freq: Two times a day (BID) | ORAL | 0 refills | Status: DC

## 2020-05-22 NOTE — ED Provider Notes (Signed)
[name]    CSN: [id number] Arrival date & time: 05/22/20  1106      History   Chief Complaint Chief Complaint  Patient presents with  . Abdominal Pain  . Dysuria    HPI Catherine Arias is a 75 y.o. female.   Patient here today with 3-day history of worsening lower abdominal pain, flank pain bilaterally, dysuria, weakness and fatigue.  She denies hematuria, nausea, vomiting, urinary frequency, fever, chills, recent sick contacts, new medications, foods, travel.  Has been taking Azo with mild temporary relief.  She is also requesting repeat chest x-ray as she recently had pneumonia at the end of January.  She states her respiratory symptoms have completely resolved and no residual shortness of breath, coughing.     Past Medical History:  Diagnosis Date  . Arthritis   . Back pain   . Fibromyalgia   . GERD (gastroesophageal reflux disease)   . Hiatal hernia   . High cholesterol   . Hypertension   . Joint pain   . OAB (overactive bladder)   . Pneumonia   . PONV (postoperative nausea and vomiting)     Patient Active Problem List   Diagnosis Date Noted  . Prediabetes 10/03/2019  . Class 1 obesity due to excess calories with body mass index (BMI) of 30.0 to 30.9 in adult 10/02/2019  . High blood pressure   . Vaginal vault prolapse 02/01/2019  . Recurrent UTI 12/28/2018  . Hyperlipidemia 02/18/2018  . Cystocele with prolapse 07/13/2017  . HTN (hypertension) 09/25/2014  . CONSTIPATION 05/27/2009  . PERSONAL HX COLONIC POLYPS 05/27/2009    Past Surgical History:  Procedure Laterality Date  . ABDOMINAL HYSTERECTOMY    . ANTERIOR AND POSTERIOR REPAIR N/A 07/13/2017   Procedure: ANTERIOR (CYSTOCELE) AND POSTERIOR REPAIR (RECTOCELE);  Surgeon: Alfredo Martinez, MD;  Location: [facility];  Service: Urology;  Laterality: N/A;  . APPENDECTOMY    . CYSTOSCOPY N/A 07/13/2017   Procedure: CYSTOSCOPY;  Surgeon: Alfredo Martinez, MD;  Location: [facility];  Service:  Urology;  Laterality: N/A;  . INNER EAR SURGERY     2014  . repair cystocele redctocele vault prolapse graft cystoscopy     07-13-17 Dr. Sherron Monday    OB History    Gravida  2   Para  2   Term      Preterm      AB      Living        SAB      IAB      Ectopic      Multiple      Live Births               Home Medications    Prior to Admission medications   Medication Sig Start Date End Date Taking? Authorizing Provider  ciprofloxacin (CIPRO) 500 MG tablet Take 1 tablet (500 mg total) by mouth 2 (two) times daily. 05/22/20  Yes Particia Nearing, PA-C  Ascorbic Acid (VITAMIN C) 100 MG tablet Take 100 mg by mouth daily.    [provider]  benzonatate (TESSALON) 100 MG capsule Take 1-2 capsules (100-200 mg total) by mouth 3 (three) times daily as needed. 04/03/20   Wallis Bamberg, PA-C  estradiol (ESTRACE) 0.1 MG/GM vaginal cream Place 1 Applicatorful vaginally at bedtime.    [provider]  hydrochlorothiazide (MICROZIDE) 12.5 MG capsule Take 12.5 mg by mouth daily.    [provider]  losartan (COZAAR) 50 MG tablet Take  50 mg by mouth daily.     [provider]  omeprazole (PRILOSEC) 20 MG capsule Take 20 mg by mouth daily.    [provider]  promethazine-dextromethorphan (PROMETHAZINE-DM) 6.25-15 MG/5ML syrup Take 5 mLs by mouth at bedtime as needed for cough. 04/03/20   Wallis Bamberg, PA-C  solifenacin (VESICARE) 10 MG tablet Take 10 mg by mouth daily.    [provider]  Vitamin D, Ergocalciferol, (DRISDOL) 1.25 MG (50000 UNIT) CAPS capsule Take 1 capsule (50,000 Units total) by mouth every 7 (seven) days. 01/09/20   Roswell Nickel, DO    Family History Family History  Problem Relation Age of Onset  . High blood pressure Mother   . Diabetes Father   . High Cholesterol Father   . Heart disease Father   . Sudden death Father     Social History Social History   Tobacco Use  . Smoking status: Never  Smoker  . Smokeless tobacco: Never Used  Vaping Use  . Vaping Use: Never used  Substance Use Topics  . Alcohol use: No    Alcohol/week: 0.0 standard drinks  . Drug use: No     Allergies   Sulfa antibiotics, Amoxicillin, Sulfonamide derivatives, and Penicillins   Review of Systems Review of Systems Per HPI  Physical Exam Triage Vital Signs ED Triage Vitals  Enc Vitals Group     BP 05/22/20 1121 (!) 145/80     Pulse Rate 05/22/20 1121 67     Resp 05/22/20 1121 18     Temp 05/22/20 1121 97.9 F (36.6 C)     Temp src --      SpO2 05/22/20 1121 95 %     Weight --      Height --      Head Circumference --      Peak Flow --      Pain Score 05/22/20 1123 4     Pain Loc --      Pain Edu? --      Excl. in GC? --    No data found.  Updated Vital Signs BP (!) 145/80   Pulse 67   Temp 97.9 F (36.6 C)   Resp 18   SpO2 95%   Visual Acuity Right Eye Distance:   Left Eye Distance:   Bilateral Distance:    Right Eye Near:   Left Eye Near:    Bilateral Near:     Physical Exam Vitals and nursing note reviewed.  Constitutional:      Appearance: Normal appearance. She is not ill-appearing.  HENT:     Head: Atraumatic.     Mouth/Throat:     Mouth: Mucous membranes are moist.     Pharynx: Oropharynx is clear.  Eyes:     Extraocular Movements: Extraocular movements intact.     Conjunctiva/sclera: Conjunctivae normal.  Cardiovascular:     Rate and Rhythm: Normal rate and regular rhythm.     Heart sounds: Normal heart sounds.  Pulmonary:     Effort: Pulmonary effort is normal.     Breath sounds: Normal breath sounds.  Abdominal:     General: Bowel sounds are normal. There is no distension.     Palpations: Abdomen is soft.     Tenderness: There is abdominal tenderness. There is no right CVA tenderness, left CVA tenderness or guarding.     Comments: Mild suprapubic tenderness to palpation  Musculoskeletal:        General: Normal range of motion.  Cervical  back: Normal range of motion and neck supple.  Skin:    General: Skin is warm and dry.  Neurological:     Mental Status: She is alert and oriented to person, place, and time.     Motor: No weakness.     Gait: Gait normal.  Psychiatric:        Mood and Affect: Mood normal.        Thought Content: Thought content normal.        Judgment: Judgment normal.      UC Treatments / Results  Labs (all labs ordered are listed, but only abnormal results are displayed) Labs Reviewed  URINE CULTURE  POCT URINALYSIS DIP (MANUAL ENTRY)    EKG   Radiology DG Chest 2 View  Result Date: 05/22/2020 CLINICAL DATA:  Post COVID follow-up EXAM: CHEST - 2 VIEW COMPARISON:  04/03/2020 FINDINGS: Heart is normal size. Aortic atherosclerosis. Lungs clear. No effusions or acute bony abnormality. IMPRESSION: No active cardiopulmonary disease. Electronically Signed   By: Charlett Nose M.D.   On: 05/22/2020 11:50    Procedures Procedures (including critical care time)  Medications Ordered in UC Medications - No data to display  Initial Impression / Assessment and Plan / UC Course  I have reviewed the triage vital signs and the nursing notes.  Pertinent labs & imaging results that were available during my care of the patient were reviewed by me and considered in my medical decision making (see chart for details).     Vital signs and exam overall reassuring today, chest x-ray benign and improved from previous, UA today within normal limits.  We will send out a urine culture just based on her symptoms and history of urinary tract infections as well as start a course of Cipro as she states this is very consistent with what her urinary tract infections have felt like in the past.  Push fluids, over-the-counter pain relievers as needed, close PCP follow-up for recheck in the next few days.  Return sooner if acutely worsening symptoms.  Final Clinical Impressions(s) / UC Diagnoses   Final diagnoses:   Dysuria  Flank pain  History of pneumonia  Fatigue, unspecified type   Discharge Instructions   None    ED Prescriptions    Medication Sig Dispense Auth. Provider   ciprofloxacin (CIPRO) 500 MG tablet Take 1 tablet (500 mg total) by mouth 2 (two) times daily. 10 tablet Particia Nearing, [place]     PDMP not reviewed this encounter.   Particia Nearing, [place] 05/22/20 1201

## 2020-05-22 NOTE — ED Triage Notes (Signed)
Pt presents with c/o lower abdominal pain with dysuria that began yesterday, pt states she was advised to get follow up chest xray as well, post covid pneumonia

## 2020-05-23 LAB — URINE CULTURE: Culture: NO GROWTH

## 2020-05-31 DIAGNOSIS — E782 Mixed hyperlipidemia: Secondary | ICD-10-CM | POA: Diagnosis not present

## 2020-05-31 DIAGNOSIS — M85852 Other specified disorders of bone density and structure, left thigh: Secondary | ICD-10-CM | POA: Diagnosis not present

## 2020-06-20 DIAGNOSIS — M5459 Other low back pain: Secondary | ICD-10-CM | POA: Diagnosis not present

## 2020-06-26 DIAGNOSIS — N3281 Overactive bladder: Secondary | ICD-10-CM | POA: Diagnosis not present

## 2020-06-26 DIAGNOSIS — Z87440 Personal history of urinary (tract) infections: Secondary | ICD-10-CM | POA: Diagnosis not present

## 2020-08-02 ENCOUNTER — Other Ambulatory Visit: Payer: Self-pay | Admitting: Family Medicine

## 2020-08-02 DIAGNOSIS — Z1231 Encounter for screening mammogram for malignant neoplasm of breast: Secondary | ICD-10-CM

## 2020-08-06 ENCOUNTER — Ambulatory Visit
Admission: RE | Admit: 2020-08-06 | Discharge: 2020-08-06 | Disposition: A | Discharge status: Home / Self Care | Payer: Medicare Other | Source: Ambulatory Visit | Attending: Family Medicine | Admitting: Family Medicine

## 2020-08-06 ENCOUNTER — Other Ambulatory Visit: Payer: Self-pay

## 2020-08-06 ENCOUNTER — Encounter: Payer: Self-pay | Admitting: Emergency Medicine

## 2020-08-06 ENCOUNTER — Ambulatory Visit
Admission: EM | Admit: 2020-08-06 | Discharge: 2020-08-06 | Disposition: A | Discharge status: Home / Self Care | Payer: Medicare Other | Attending: Family Medicine | Admitting: Family Medicine

## 2020-08-06 DIAGNOSIS — Z1231 Encounter for screening mammogram for malignant neoplasm of breast: Secondary | ICD-10-CM

## 2020-08-06 DIAGNOSIS — H66002 Acute suppurative otitis media without spontaneous rupture of ear drum, left ear: Secondary | ICD-10-CM

## 2020-08-06 MED ORDER — AZITHROMYCIN 250 MG PO TABS: ORAL_TABLET | ORAL | 0 refills | Status: DC

## 2020-08-06 NOTE — ED Triage Notes (Signed)
Pt is resent today with bilatieral ear pain. Pt states that her sx started two days ago.

## 2020-08-06 NOTE — ED Provider Notes (Signed)
[name]    CSN: [id number] Arrival date & time: 08/06/20  1031      History   Chief Complaint Chief Complaint  Patient presents with  . Otalgia    HPI Catherine Arias is a 75 y.o. female.   HPI  Patient presents today with bilateral ear pain.  Pain has been present for 2 days.  Denies any history of recurrent ear infections. She denies any associated URI symptoms. Her left ear the pain is actually radiating down her jaw.   Past Medical History:  Diagnosis Date  . Arthritis   . Back pain   . Fibromyalgia   . GERD (gastroesophageal reflux disease)   . Hiatal hernia   . High cholesterol   . Hypertension   . Joint pain   . OAB (overactive bladder)   . Pneumonia   . PONV (postoperative nausea and vomiting)     Patient Active Problem List   Diagnosis Date Noted  . Prediabetes 10/03/2019  . Class 1 obesity due to excess calories with body mass index (BMI) of 30.0 to 30.9 in adult 10/02/2019  . High blood pressure   . Vaginal vault prolapse 02/01/2019  . Recurrent UTI 12/28/2018  . Hyperlipidemia 02/18/2018  . Cystocele with prolapse 07/13/2017  . HTN (hypertension) 09/25/2014  . CONSTIPATION 05/27/2009  . PERSONAL HX COLONIC POLYPS 05/27/2009    Past Surgical History:  Procedure Laterality Date  . ABDOMINAL HYSTERECTOMY    . ANTERIOR AND POSTERIOR REPAIR N/A 07/13/2017   Procedure: ANTERIOR (CYSTOCELE) AND POSTERIOR REPAIR (RECTOCELE);  Surgeon: Alfredo Martinez, MD;  Location: [facility];  Service: Urology;  Laterality: N/A;  . APPENDECTOMY    . CYSTOSCOPY N/A 07/13/2017   Procedure: CYSTOSCOPY;  Surgeon: Alfredo Martinez, MD;  Location: [facility];  Service: Urology;  Laterality: N/A;  . INNER EAR SURGERY     2014  . repair cystocele redctocele vault prolapse graft cystoscopy     07-13-17 Dr. Sherron Monday    OB History    Gravida  2   Para  2   Term      Preterm      AB      Living        SAB      IAB      Ectopic      Multiple       Live Births               Home Medications    Prior to Admission medications   Medication Sig Start Date End Date Taking? Authorizing Provider  Ascorbic Acid (VITAMIN C) 100 MG tablet Take 100 mg by mouth daily.    [provider]  benzonatate (TESSALON) 100 MG capsule Take 1-2 capsules (100-200 mg total) by mouth 3 (three) times daily as needed. 04/03/20   Wallis Bamberg, PA-C  ciprofloxacin (CIPRO) 500 MG tablet Take 1 tablet (500 mg total) by mouth 2 (two) times daily. 05/22/20   Particia Nearing, PA-C  estradiol (ESTRACE) 0.1 MG/GM vaginal cream Place 1 Applicatorful vaginally at bedtime.    [provider]  hydrochlorothiazide (MICROZIDE) 12.5 MG capsule Take 12.5 mg by mouth daily.    [provider]  losartan (COZAAR) 50 MG tablet Take 50 mg by mouth daily.     [provider]  omeprazole (PRILOSEC) 20 MG capsule Take 20 mg by mouth daily.    [provider]  promethazine-dextromethorphan (PROMETHAZINE-DM) 6.25-15 MG/5ML syrup Take 5 mLs by mouth at  bedtime as needed for cough. 04/03/20   Wallis Bamberg, PA-C  solifenacin (VESICARE) 10 MG tablet Take 10 mg by mouth daily.    [provider]  Vitamin D, Ergocalciferol, (DRISDOL) 1.25 MG (50000 UNIT) CAPS capsule Take 1 capsule (50,000 Units total) by mouth every 7 (seven) days. 01/09/20   [place] Nickel, DO    Family History Family History  Problem Relation Age of Onset  . High blood pressure Mother   . Diabetes Father   . High Cholesterol Father   . Heart disease Father   . Sudden death Father     Social History Social History   Tobacco Use  . Smoking status: Never Smoker  . Smokeless tobacco: Never Used  Vaping Use  . Vaping Use: Never used  Substance Use Topics  . Alcohol use: No    Alcohol/week: 0.0 standard drinks  . Drug use: No     Allergies   Sulfa antibiotics, Amoxicillin, Sulfonamide derivatives, and Penicillins   Review of Systems Review of  Systems Pertinent negatives listed in HPI  Physical Exam Triage Vital Signs ED Triage Vitals  Enc Vitals Group     BP 08/06/20 1325 127/76     Pulse Rate 08/06/20 1325 63     Resp 08/06/20 1325 18     Temp 08/06/20 1325 (!) 97.3 F (36.3 C)     Temp Source 08/06/20 1325 Oral     SpO2 08/06/20 1325 95 %     Weight --      Height --      Head Circumference --      Peak Flow --      Pain Score 08/06/20 1324 8     Pain Loc --      Pain Edu? --      Excl. in GC? --    No data found.  Updated Vital Signs BP 127/76   Pulse 63   Temp (!) 97.3 F (36.3 C) (Oral)   Resp 18   SpO2 95%   Visual Acuity Right Eye Distance:   Left Eye Distance:   Bilateral Distance:    Right Eye Near:   Left Eye Near:    Bilateral Near:     Physical Exam HENT:     Head: Normocephalic.     Right Ear: Hearing, tympanic membrane, ear canal and external ear normal.     Left Ear: Swelling and tenderness present. A middle ear effusion is present. Tympanic membrane is erythematous and bulging.  Cardiovascular:     Rate and Rhythm: Normal rate and regular rhythm.  Pulmonary:     Effort: Pulmonary effort is normal.  Skin:    Capillary Refill: Capillary refill takes less than 2 seconds.  Neurological:     General: No focal deficit present.     Mental Status: She is alert.  Psychiatric:        Mood and Affect: Mood normal.        Behavior: Behavior normal.        Thought Content: Thought content normal.        Judgment: Judgment normal.      UC Treatments / Results  Labs (all labs ordered are listed, but only abnormal results are displayed) Labs Reviewed - No data to display  EKG   Radiology No results found.  Procedures Procedures (including critical care time)  Medications Ordered in UC Medications - No data to display  Initial Impression / Assessment and Plan / UC Course  I have reviewed the triage vital signs and the nursing notes.  Pertinent labs & imaging results that  were available during my care of the patient were reviewed by me and considered in my medical decision making (see chart for details).    Nonrecurrent acute otitis media involving the left ear.  Treatment with azithromycin.  Return precautions given. Final Clinical Impressions(s) / UC Diagnoses   Final diagnoses:  Non-recurrent acute suppurative otitis media of left ear without spontaneous rupture of tympanic membrane   Discharge Instructions   None    ED Prescriptions    Medication Sig Dispense Auth. Provider   azithromycin (ZITHROMAX) 250 MG tablet Take 2 tabs PO x 1 dose, then 1 tab PO QD x 4 days 6 tablet Bing Neighbors, FNP     PDMP not reviewed this encounter.   Bing Neighbors, FNP 08/06/20 1531

## 2020-08-09 DIAGNOSIS — N1832 Chronic kidney disease, stage 3b: Secondary | ICD-10-CM | POA: Diagnosis not present

## 2020-08-09 DIAGNOSIS — N3281 Overactive bladder: Secondary | ICD-10-CM | POA: Diagnosis not present

## 2020-08-09 DIAGNOSIS — E782 Mixed hyperlipidemia: Secondary | ICD-10-CM | POA: Diagnosis not present

## 2020-08-09 DIAGNOSIS — Z8616 Personal history of COVID-19: Secondary | ICD-10-CM | POA: Diagnosis not present

## 2020-08-09 DIAGNOSIS — Z1389 Encounter for screening for other disorder: Secondary | ICD-10-CM | POA: Diagnosis not present

## 2020-08-09 DIAGNOSIS — I10 Essential (primary) hypertension: Secondary | ICD-10-CM | POA: Diagnosis not present

## 2020-08-09 DIAGNOSIS — M85852 Other specified disorders of bone density and structure, left thigh: Secondary | ICD-10-CM | POA: Diagnosis not present

## 2020-08-09 DIAGNOSIS — H60502 Unspecified acute noninfective otitis externa, left ear: Secondary | ICD-10-CM | POA: Diagnosis not present

## 2020-08-09 DIAGNOSIS — E559 Vitamin D deficiency, unspecified: Secondary | ICD-10-CM | POA: Diagnosis not present

## 2020-08-09 DIAGNOSIS — Z0000 Encounter for general adult medical examination without abnormal findings: Secondary | ICD-10-CM | POA: Diagnosis not present

## 2020-08-15 DIAGNOSIS — H60392 Other infective otitis externa, left ear: Secondary | ICD-10-CM | POA: Diagnosis not present

## 2020-08-15 DIAGNOSIS — H6012 Cellulitis of left external ear: Secondary | ICD-10-CM | POA: Diagnosis not present

## 2020-08-29 ENCOUNTER — Other Ambulatory Visit: Payer: Self-pay

## 2020-08-29 ENCOUNTER — Ambulatory Visit
Admission: RE | Admit: 2020-08-29 | Discharge: 2020-08-29 | Disposition: A | Discharge status: Home / Self Care | Payer: Medicare Other | Source: Ambulatory Visit | Attending: Student | Admitting: Student

## 2020-08-29 VITALS — BP 108/70 | HR 84 | Temp 98.9°F | Resp 18

## 2020-08-29 DIAGNOSIS — H66005 Acute suppurative otitis media without spontaneous rupture of ear drum, recurrent, left ear: Secondary | ICD-10-CM | POA: Diagnosis not present

## 2020-08-29 MED ORDER — DOXYCYCLINE HYCLATE 100 MG PO CAPS: 100.0000 mg | ORAL_CAPSULE | Freq: Two times a day (BID) | ORAL | 0 refills | Status: AC

## 2020-08-29 NOTE — ED Triage Notes (Signed)
Pt c/o lt ear pain and popping since yesterday. States has been tx'd twice in the month for a lt ear infection.

## 2020-08-29 NOTE — Discharge Instructions (Addendum)
-  Doxycycline twice daily for 7 days.  Make sure to wear sunscreen while spending time outside while on this medication as it can increase your chance of sunburn. You can take this medication with food if you have a sensitive stomach. -Follow-up with primary care if symptoms persist. They can help you schedule an appointment with and ear nose and throat doctor

## 2020-08-29 NOTE — ED Provider Notes (Signed)
[name]    CSN: [id number] Arrival date & time: 08/29/20  1354      History   Chief Complaint Chief Complaint  Patient presents with   appt 2- lt ear pain    HPI Catherine Arias is a 75 y.o. female presenting with L ear pain x2 days. Medical history fibromyalgia, GERD, hiatal hernia, hyperlipidemia, hypertension, overactive bladder, pneumonia.  Patient states that she was first treated for this left ear infection 1 month ago with azithromycin at our urgent care.  States that her symptoms got better, but then returned about 1 week ago.  Was treated with ciprofloxacin, states that symptoms again improved but then returned.  Endorses 2 days of right ear pain and muffled hearing.  Denies tinnitus, dizziness, fever/chills, cough.  Has not followed up with ear nose and throat doctor, but states that her primary care says they will help her schedule this.  HPI  Past Medical History:  Diagnosis Date   Arthritis    Back pain    Fibromyalgia    GERD (gastroesophageal reflux disease)    Hiatal hernia    High cholesterol    Hypertension    Joint pain    OAB (overactive bladder)    Pneumonia    PONV (postoperative nausea and vomiting)     Patient Active Problem List   Diagnosis Date Noted   Prediabetes 10/03/2019   Class 1 obesity due to excess calories with body mass index (BMI) of 30.0 to 30.9 in adult 10/02/2019   High blood pressure    Vaginal vault prolapse 02/01/2019   Recurrent UTI 12/28/2018   Hyperlipidemia 02/18/2018   Cystocele with prolapse 07/13/2017   HTN (hypertension) 09/25/2014   CONSTIPATION 05/27/2009   PERSONAL HX COLONIC POLYPS 05/27/2009    Past Surgical History:  Procedure Laterality Date   ABDOMINAL HYSTERECTOMY     ANTERIOR AND POSTERIOR REPAIR N/A 07/13/2017   Procedure: ANTERIOR (CYSTOCELE) AND POSTERIOR REPAIR (RECTOCELE);  Surgeon: Alfredo Martinez, MD;  Location: [facility];  Service: Urology;  Laterality: N/A;   APPENDECTOMY      CYSTOSCOPY N/A 07/13/2017   Procedure: CYSTOSCOPY;  Surgeon: Alfredo Martinez, MD;  Location: [facility];  Service: Urology;  Laterality: N/A;   INNER EAR SURGERY     2014   repair cystocele redctocele vault prolapse graft cystoscopy     07-13-17 Dr. Sherron Monday    OB History     Gravida  2   Para  2   Term      Preterm      AB      Living         SAB      IAB      Ectopic      Multiple      Live Births               Home Medications    Prior to Admission medications   Medication Sig Start Date End Date Taking? Authorizing Provider  doxycycline (VIBRAMYCIN) 100 MG capsule Take 1 capsule (100 mg total) by mouth 2 (two) times daily for 7 days. 08/29/20 09/05/20 Yes Rhys Martini, PA-C  Ascorbic Acid (VITAMIN C) 100 MG tablet Take 100 mg by mouth daily.    [provider]  benzonatate (TESSALON) 100 MG capsule Take 1-2 capsules (100-200 mg total) by mouth 3 (three) times daily as needed. 04/03/20   Wallis Bamberg, PA-C  estradiol (ESTRACE) 0.1 MG/GM vaginal cream Place 1 Applicatorful vaginally at  bedtime.    [provider]  hydrochlorothiazide (MICROZIDE) 12.5 MG capsule Take 12.5 mg by mouth daily.    [provider]  losartan (COZAAR) 50 MG tablet Take 50 mg by mouth daily.     [provider]  omeprazole (PRILOSEC) 20 MG capsule Take 20 mg by mouth daily.    [provider]  solifenacin (VESICARE) 10 MG tablet Take 10 mg by mouth daily.    [provider]  Vitamin D, Ergocalciferol, (DRISDOL) 1.25 MG (50000 UNIT) CAPS capsule Take 1 capsule (50,000 Units total) by mouth every 7 (seven) days. 01/09/20   Roswell Nickel, DO    Family History Family History  Problem Relation Age of Onset   High blood pressure Mother    Diabetes Father    High Cholesterol Father    Heart disease Father    Sudden death Father    Breast cancer Cousin 42    Social History Social History   Tobacco Use   Smoking status: Never    Smokeless tobacco: Never  Vaping Use   Vaping Use: Never used  Substance Use Topics   Alcohol use: No    Alcohol/week: 0.0 standard drinks   Drug use: No     Allergies   Sulfa antibiotics, Amoxicillin, Sulfonamide derivatives, and Penicillins   Review of Systems Review of Systems  Constitutional:  Negative for appetite change, chills and fever.  HENT:  Positive for ear pain. Negative for congestion, rhinorrhea, sinus pressure, sinus pain and sore throat.   Eyes:  Negative for redness and visual disturbance.  Respiratory:  Negative for cough, chest tightness, shortness of breath and wheezing.   Cardiovascular:  Negative for chest pain and palpitations.  Gastrointestinal:  Negative for abdominal pain, constipation, diarrhea, nausea and vomiting.  Genitourinary:  Negative for dysuria, frequency and urgency.  Musculoskeletal:  Negative for myalgias.  Neurological:  Negative for dizziness, weakness and headaches.  Psychiatric/Behavioral:  Negative for confusion.   All other systems reviewed and are negative.   Physical Exam Triage Vital Signs ED Triage Vitals [08/29/20 1426]  Enc Vitals Group     BP 108/70     Pulse Rate 84     Resp 18     Temp 98.9 F (37.2 C)     Temp Source Oral     SpO2 94 %     Weight      Height      Head Circumference      Peak Flow      Pain Score 8     Pain Loc      Pain Edu?      Excl. in GC?    No data found.  Updated Vital Signs BP 108/70 (BP Location: Left Arm)   Pulse 84   Temp 98.9 F (37.2 C) (Oral)   Resp 18   SpO2 94%   Visual Acuity Right Eye Distance:   Left Eye Distance:   Bilateral Distance:    Right Eye Near:   Left Eye Near:    Bilateral Near:     Physical Exam Vitals reviewed.  Constitutional:      General: She is not in acute distress.    Appearance: Normal appearance. She is not ill-appearing.  HENT:     Head: Normocephalic and atraumatic.     Right Ear: Hearing, tympanic membrane, ear canal and external  ear normal. No swelling or tenderness. There is no impacted cerumen. No mastoid tenderness. Tympanic membrane is not perforated,  erythematous, retracted or bulging.     Left Ear: Hearing and external ear normal. Swelling and tenderness present. There is no impacted cerumen. No mastoid tenderness.     Ears:     Comments: Unable to visualize L TM due to swelling and discomfort    Nose:     Right Sinus: No maxillary sinus tenderness or frontal sinus tenderness.     Left Sinus: No maxillary sinus tenderness or frontal sinus tenderness.     Mouth/Throat:     Mouth: Mucous membranes are moist.     Pharynx: Uvula midline. No oropharyngeal exudate or posterior oropharyngeal erythema.     Tonsils: No tonsillar exudate.  Cardiovascular:     Rate and Rhythm: Normal rate and regular rhythm.     Heart sounds: Normal heart sounds.  Pulmonary:     Breath sounds: Normal breath sounds and air entry. No wheezing, rhonchi or rales.  Chest:     Chest wall: No tenderness.  Abdominal:     General: Abdomen is flat. Bowel sounds are normal.     Tenderness: There is no abdominal tenderness. There is no guarding or rebound.  Lymphadenopathy:     Cervical: Cervical adenopathy present.     Left cervical: Superficial cervical adenopathy present.  Neurological:     General: No focal deficit present.     Mental Status: She is alert and oriented to person, place, and time.  Psychiatric:        Attention and Perception: Attention and perception normal.        Mood and Affect: Mood and affect normal.        Behavior: Behavior normal. Behavior is cooperative.        Thought Content: Thought content normal.        Judgment: Judgment normal.     UC Treatments / Results  Labs (all labs ordered are listed, but only abnormal results are displayed) Labs Reviewed - No data to display  EKG   Radiology No results found.  Procedures Procedures (including critical care time)  Medications Ordered in  UC Medications - No data to display  Initial Impression / Assessment and Plan / UC Course  I have reviewed the triage vital signs and the nursing notes.  Pertinent labs & imaging results that were available during my care of the patient were reviewed by me and considered in my medical decision making (see chart for details).     This patient is a very pleasant [age] y.o. year old female presenting with recurrent L AOM.  Unable to visualize tympanic membrane due to swelling and pain.  Afebrile, nontachycardic.  Penicillin allergic, and has been treated with z-pack and ciprofloxacin for this AOM already. Doxycycline sent as below.   Given we have already treated this recurrent otitis media with 2 antibiotics and this is the third recurrence in 1 month, f/u with ENT if symptoms worsen/persist.  She verbalizes understanding and agreement.  ED return precautions discussed. Patient verbalizes understanding and agreement.   Final Clinical Impressions(s) / UC Diagnoses   Final diagnoses:  Recurrent acute suppurative otitis media without spontaneous rupture of left tympanic membrane     Discharge Instructions      -Doxycycline twice daily for 7 days.  Make sure to wear sunscreen while spending time outside while on this medication as it can increase your chance of sunburn. You can take this medication with food if you have a sensitive stomach. -Follow-up with primary care if symptoms persist. They can  help you schedule an appointment with and ear nose and throat doctor      ED Prescriptions     Medication Sig Dispense Auth. Provider   doxycycline (VIBRAMYCIN) 100 MG capsule Take 1 capsule (100 mg total) by mouth 2 (two) times daily for 7 days. 14 capsule Rhys Martini, PA-C      PDMP not reviewed this encounter.   Rhys Martini, PA-C 08/29/20 1444

## 2020-09-13 DIAGNOSIS — H6122 Impacted cerumen, left ear: Secondary | ICD-10-CM | POA: Diagnosis not present

## 2020-09-13 DIAGNOSIS — H6983 Other specified disorders of Eustachian tube, bilateral: Secondary | ICD-10-CM | POA: Diagnosis not present

## 2020-09-17 DIAGNOSIS — J019 Acute sinusitis, unspecified: Secondary | ICD-10-CM | POA: Diagnosis not present

## 2020-09-25 DIAGNOSIS — H6123 Impacted cerumen, bilateral: Secondary | ICD-10-CM | POA: Diagnosis not present

## 2020-09-25 DIAGNOSIS — J302 Other seasonal allergic rhinitis: Secondary | ICD-10-CM | POA: Diagnosis not present

## 2020-10-10 DIAGNOSIS — I10 Essential (primary) hypertension: Secondary | ICD-10-CM | POA: Diagnosis not present

## 2020-10-10 DIAGNOSIS — E782 Mixed hyperlipidemia: Secondary | ICD-10-CM | POA: Diagnosis not present

## 2020-10-10 DIAGNOSIS — N1832 Chronic kidney disease, stage 3b: Secondary | ICD-10-CM | POA: Diagnosis not present

## 2020-10-10 DIAGNOSIS — K219 Gastro-esophageal reflux disease without esophagitis: Secondary | ICD-10-CM | POA: Diagnosis not present

## 2020-11-20 DIAGNOSIS — E782 Mixed hyperlipidemia: Secondary | ICD-10-CM | POA: Diagnosis not present

## 2020-11-20 DIAGNOSIS — K219 Gastro-esophageal reflux disease without esophagitis: Secondary | ICD-10-CM | POA: Diagnosis not present

## 2020-11-20 DIAGNOSIS — I10 Essential (primary) hypertension: Secondary | ICD-10-CM | POA: Diagnosis not present

## 2020-11-20 DIAGNOSIS — N183 Chronic kidney disease, stage 3 unspecified: Secondary | ICD-10-CM | POA: Diagnosis not present

## 2020-11-25 DIAGNOSIS — M7632 Iliotibial band syndrome, left leg: Secondary | ICD-10-CM | POA: Diagnosis not present

## 2020-11-25 DIAGNOSIS — M7631 Iliotibial band syndrome, right leg: Secondary | ICD-10-CM | POA: Diagnosis not present

## 2020-12-03 DIAGNOSIS — M25551 Pain in right hip: Secondary | ICD-10-CM | POA: Diagnosis not present

## 2020-12-03 DIAGNOSIS — M25552 Pain in left hip: Secondary | ICD-10-CM | POA: Diagnosis not present

## 2020-12-05 DIAGNOSIS — M25551 Pain in right hip: Secondary | ICD-10-CM | POA: Diagnosis not present

## 2020-12-05 DIAGNOSIS — M25552 Pain in left hip: Secondary | ICD-10-CM | POA: Diagnosis not present

## 2020-12-09 DIAGNOSIS — M25551 Pain in right hip: Secondary | ICD-10-CM | POA: Diagnosis not present

## 2020-12-09 DIAGNOSIS — M25552 Pain in left hip: Secondary | ICD-10-CM | POA: Diagnosis not present

## 2020-12-18 DIAGNOSIS — M25552 Pain in left hip: Secondary | ICD-10-CM | POA: Diagnosis not present

## 2020-12-18 DIAGNOSIS — M25551 Pain in right hip: Secondary | ICD-10-CM | POA: Diagnosis not present

## 2020-12-24 DIAGNOSIS — M25551 Pain in right hip: Secondary | ICD-10-CM | POA: Diagnosis not present

## 2020-12-24 DIAGNOSIS — M25552 Pain in left hip: Secondary | ICD-10-CM | POA: Diagnosis not present

## 2020-12-31 DIAGNOSIS — M25552 Pain in left hip: Secondary | ICD-10-CM | POA: Diagnosis not present

## 2020-12-31 DIAGNOSIS — M25551 Pain in right hip: Secondary | ICD-10-CM | POA: Diagnosis not present

## 2021-01-03 ENCOUNTER — Ambulatory Visit
Admission: RE | Admit: 2021-01-03 | Discharge: 2021-01-03 | Disposition: A | Discharge status: Home / Self Care | Payer: Medicare Other | Source: Ambulatory Visit | Attending: Emergency Medicine | Admitting: Emergency Medicine

## 2021-01-03 ENCOUNTER — Other Ambulatory Visit: Payer: Self-pay

## 2021-01-03 VITALS — BP 156/79 | HR 66 | Temp 98.0°F | Resp 18

## 2021-01-03 DIAGNOSIS — H9209 Otalgia, unspecified ear: Secondary | ICD-10-CM

## 2021-01-03 MED ORDER — DOXYCYCLINE HYCLATE 100 MG PO CAPS: 100.0000 mg | ORAL_CAPSULE | Freq: Two times a day (BID) | ORAL | 0 refills | Status: AC

## 2021-01-03 MED ORDER — FLUOCINOLONE ACETONIDE 0.01 % OT OIL: 5.0000 [drp] | TOPICAL_OIL | Freq: Two times a day (BID) | OTIC | 0 refills | Status: AC

## 2021-01-03 NOTE — ED Provider Notes (Signed)
[facility]    CSN: [id number] Arrival date & time: 01/03/21  1023      History   Chief Complaint Chief Complaint  Patient presents with   Otalgia   appt 10:45    HPI Catherine Arias is a 75 y.o. female.   Patient reports right ear pain since yesterday.  Patient reports an allergy to multiple antibiotics, is requesting a prescription for doxycycline as this is the only medication that helps her.  Patient denies fever, chills, nausea, vomiting, diarrhea, headache, hearing loss, drainage from right ear, loss of taste or smell, sore throat, body ache.  The history is provided by the patient.   Past Medical History:  Diagnosis Date   Arthritis    Back pain    Fibromyalgia    GERD (gastroesophageal reflux disease)    Hiatal hernia    High cholesterol    Hypertension    Joint pain    OAB (overactive bladder)    Pneumonia    PONV (postoperative nausea and vomiting)     Patient Active Problem List   Diagnosis Date Noted   Prediabetes 10/03/2019   Class 1 obesity due to excess calories with body mass index (BMI) of 30.0 to 30.9 in adult 10/02/2019   High blood pressure    Vaginal vault prolapse 02/01/2019   Recurrent UTI 12/28/2018   Hyperlipidemia 02/18/2018   Cystocele with prolapse 07/13/2017   HTN (hypertension) 09/25/2014   CONSTIPATION 05/27/2009   PERSONAL HX COLONIC POLYPS 05/27/2009    Past Surgical History:  Procedure Laterality Date   ABDOMINAL HYSTERECTOMY     ANTERIOR AND POSTERIOR REPAIR N/A 07/13/2017   Procedure: ANTERIOR (CYSTOCELE) AND POSTERIOR REPAIR (RECTOCELE);  Surgeon: Alfredo Martinez, MD;  Location: [facility];  Service: Urology;  Laterality: N/A;   APPENDECTOMY     CYSTOSCOPY N/A 07/13/2017   Procedure: CYSTOSCOPY;  Surgeon: Alfredo Martinez, MD;  Location: [facility];  Service: Urology;  Laterality: N/A;   INNER EAR SURGERY     2014   repair cystocele redctocele vault prolapse graft cystoscopy     07-13-17 Dr. Sherron Monday    OB  History     Gravida  2   Para  2   Term      Preterm      AB      Living         SAB      IAB      Ectopic      Multiple      Live Births               Home Medications    Prior to Admission medications   Medication Sig Start Date End Date Taking? Authorizing Provider  doxycycline (VIBRAMYCIN) 100 MG capsule Take 1 capsule (100 mg total) by mouth 2 (two) times daily for 10 days. 01/03/21 01/13/21 Yes Theadora Rama Scales, PA-C  Fluocinolone Acetonide 0.01 % OIL Place 5 drops in ear(s) 2 (two) times daily. 01/03/21  Yes Theadora Rama Scales, PA-C  Ascorbic Acid (VITAMIN C) 100 MG tablet Take 100 mg by mouth daily.    [provider]  benzonatate (TESSALON) 100 MG capsule Take 1-2 capsules (100-200 mg total) by mouth 3 (three) times daily as needed. 04/03/20   Wallis Bamberg, PA-C  estradiol (ESTRACE) 0.1 MG/GM vaginal cream Place 1 Applicatorful vaginally at bedtime.    [provider]  hydrochlorothiazide (MICROZIDE) 12.5 MG capsule Take 12.5 mg by mouth daily.    [provider]  losartan (COZAAR) 50 MG tablet Take 50 mg by mouth daily.     [provider]  omeprazole (PRILOSEC) 20 MG capsule Take 20 mg by mouth daily.    [provider]  Vitamin D, Ergocalciferol, (DRISDOL) 1.25 MG (50000 UNIT) CAPS capsule Take 1 capsule (50,000 Units total) by mouth every 7 (seven) days. 01/09/20   Roswell Nickel, DO    Family History Family History  Problem Relation Age of Onset   High blood pressure Mother    Diabetes Father    High Cholesterol Father    Heart disease Father    Sudden death Father    Breast cancer Cousin 74    Social History Social History   Tobacco Use   Smoking status: Never   Smokeless tobacco: Never  Vaping Use   Vaping Use: Never used  Substance Use Topics   Alcohol use: No    Alcohol/week: 0.0 standard drinks   Drug use: No     Allergies   Sulfa antibiotics, Amoxicillin, Sulfonamide  derivatives, and Penicillins   Review of Systems Review of Systems Pertinent findings noted in history of present illness.    Physical Exam Triage Vital Signs ED Triage Vitals  Enc Vitals Group     BP 01/03/21 0827 (!) 147/82     Pulse Rate 01/03/21 0827 72     Resp 01/03/21 0827 18     Temp 01/03/21 0827 98.3 F (36.8 C)     Temp Source 01/03/21 0827 Oral     SpO2 01/03/21 0827 98 %     Weight --      Height --      Head Circumference --      Peak Flow --      Pain Score 01/03/21 0826 5     Pain Loc --      Pain Edu? --      Excl. in GC? --    No data found.  Updated Vital Signs BP (!) 156/79 (BP Location: Right Arm)   Pulse 66   Temp 98 F (36.7 C) (Oral)   Resp 18   SpO2 96%   Visual Acuity Right Eye Distance:   Left Eye Distance:   Bilateral Distance:    Right Eye Near:   Left Eye Near:    Bilateral Near:     Physical Exam Vitals and nursing note reviewed.  Constitutional:      General: She is not in acute distress.    Appearance: Normal appearance. She is not ill-appearing.  HENT:     Head: Normocephalic and atraumatic.     Salivary Glands: Right salivary gland is not diffusely enlarged or tender. Left salivary gland is not diffusely enlarged or tender.     Right Ear: Ear canal and external ear normal. No drainage. A middle ear effusion is present. There is no impacted cerumen. Tympanic membrane is not erythematous or bulging.     Left Ear: Tympanic membrane, ear canal and external ear normal. No drainage.  No middle ear effusion. There is no impacted cerumen. Tympanic membrane is not erythematous or bulging.     Nose: Nose normal. No nasal deformity, septal deviation, mucosal edema, congestion or rhinorrhea.     Right Turbinates: Not enlarged, swollen or pale.     Left Turbinates: Not enlarged, swollen or pale.     Right Sinus: No maxillary sinus tenderness or frontal sinus tenderness.     Left Sinus: No maxillary sinus tenderness or  frontal sinus  tenderness.     Mouth/Throat:     Lips: Pink. No lesions.     Mouth: Mucous membranes are moist. No oral lesions.     Pharynx: Oropharynx is clear. Uvula midline. No posterior oropharyngeal erythema or uvula swelling.     Tonsils: No tonsillar exudate. 0 on the right. 0 on the left.  Eyes:     General: Lids are normal.        Right eye: No discharge.        Left eye: No discharge.     Extraocular Movements: Extraocular movements intact.     Conjunctiva/sclera: Conjunctivae normal.     Right eye: Right conjunctiva is not injected.     Left eye: Left conjunctiva is not injected.  Neck:     Trachea: Trachea and phonation normal.  Cardiovascular:     Rate and Rhythm: Normal rate and regular rhythm.     Pulses: Normal pulses.     Heart sounds: Normal heart sounds. No murmur heard.   No friction rub. No gallop.  Pulmonary:     Effort: Pulmonary effort is normal. No accessory muscle usage, prolonged expiration or respiratory distress.     Breath sounds: Normal breath sounds. No stridor, decreased air movement or transmitted upper airway sounds. No decreased breath sounds, wheezing, rhonchi or rales.  Chest:     Chest wall: No tenderness.  Musculoskeletal:        General: Normal range of motion.     Cervical back: Normal range of motion and neck supple. Normal range of motion.  Lymphadenopathy:     Cervical: No cervical adenopathy.  Skin:    General: Skin is warm and dry.     Findings: No erythema or rash.  Neurological:     General: No focal deficit present.     Mental Status: She is alert and oriented to person, place, and time.  Psychiatric:        Mood and Affect: Mood normal.        Behavior: Behavior normal.     UC Treatments / Results  Labs (all labs ordered are listed, but only abnormal results are displayed) Labs Reviewed - No data to display  EKG   Radiology No results found.  Procedures Procedures (including critical care time)  Medications Ordered in  UC Medications - No data to display  Initial Impression / Assessment and Plan / UC Course  I have reviewed the triage vital signs and the nursing notes.  Pertinent labs & imaging results that were available during my care of the patient were reviewed by me and considered in my medical decision making (see chart for details).     Patient is well-appearing on arrival today, physical exam is unremarkable.  Patient advised that her blood pressure is elevated this time, patient states is because she does not feel well.  Physical exam is mildly concerning for some questionable right ear effusion, will provide patient with prescription for doxycycline as requested as this is her only complaint today.  Conservative care also recommended.  I also prescribed her with a prescription for fluocinolone otic drops for possible allergic otalgia as I think this is more likely the cause of her ear pain.  Patient verbalized understanding and agreement of plan as discussed.  All questions were addressed during visit.  Please see discharge instructions below for further details of plan.  Final Clinical Impressions(s) / UC Diagnoses   Final diagnoses:  Otalgia, unspecified laterality  Discharge Instructions      Please begin doxycycline, 1 tablet twice daily for the next 10 days.  If you have significant improvement of your symptoms, you can stop them after 7 days but be sure you completely 7 days.  If your symptoms or not improving at least a little bit in the next 2 to 3 days, also recommend you began an eardrop called fluocinolone acetamide.  Please place 5 drops into the affected ear twice daily for 7 days.  If you try both of these medications and still do not have relief, please feel free to come back here for reevaluation or your primary care provider if you are able to get an appointment.     ED Prescriptions     Medication Sig Dispense Auth. Provider   doxycycline (VIBRAMYCIN) 100 MG  capsule Take 1 capsule (100 mg total) by mouth 2 (two) times daily for 10 days. 20 capsule Theadora Rama Scales, PA-C   Fluocinolone Acetonide 0.01 % OIL Place 5 drops in ear(s) 2 (two) times daily. 20 mL Theadora Rama Scales, PA-C      PDMP not reviewed this encounter.    Theadora Rama [place], [place] 01/06/21 [phone]

## 2021-01-03 NOTE — ED Triage Notes (Signed)
Pt reports having ear pain to right ear since yesterday.

## 2021-01-03 NOTE — Discharge Instructions (Addendum)
Please begin doxycycline, 1 tablet twice daily for the next 10 days.  If you have significant improvement of your symptoms, you can stop them after 7 days but be sure you completely 7 days.  If your symptoms or not improving at least a little bit in the next 2 to 3 days, also recommend you began an eardrop called fluocinolone acetamide.  Please place 5 drops into the affected ear twice daily for 7 days.  If you try both of these medications and still do not have relief, please feel free to come back here for reevaluation or your primary care provider if you are able to get an appointment.

## 2021-01-06 DIAGNOSIS — M7632 Iliotibial band syndrome, left leg: Secondary | ICD-10-CM | POA: Diagnosis not present

## 2021-01-06 DIAGNOSIS — M7631 Iliotibial band syndrome, right leg: Secondary | ICD-10-CM | POA: Diagnosis not present

## 2021-02-11 DIAGNOSIS — Z8616 Personal history of COVID-19: Secondary | ICD-10-CM | POA: Diagnosis not present

## 2021-02-11 DIAGNOSIS — M85852 Other specified disorders of bone density and structure, left thigh: Secondary | ICD-10-CM | POA: Diagnosis not present

## 2021-02-11 DIAGNOSIS — N1832 Chronic kidney disease, stage 3b: Secondary | ICD-10-CM | POA: Diagnosis not present

## 2021-02-11 DIAGNOSIS — E559 Vitamin D deficiency, unspecified: Secondary | ICD-10-CM | POA: Diagnosis not present

## 2021-02-11 DIAGNOSIS — K219 Gastro-esophageal reflux disease without esophagitis: Secondary | ICD-10-CM | POA: Diagnosis not present

## 2021-02-11 DIAGNOSIS — Z87440 Personal history of urinary (tract) infections: Secondary | ICD-10-CM | POA: Diagnosis not present

## 2021-02-11 DIAGNOSIS — E782 Mixed hyperlipidemia: Secondary | ICD-10-CM | POA: Diagnosis not present

## 2021-02-11 DIAGNOSIS — N3281 Overactive bladder: Secondary | ICD-10-CM | POA: Diagnosis not present

## 2021-02-11 DIAGNOSIS — M797 Fibromyalgia: Secondary | ICD-10-CM | POA: Diagnosis not present

## 2021-02-11 DIAGNOSIS — I10 Essential (primary) hypertension: Secondary | ICD-10-CM | POA: Diagnosis not present

## 2021-04-29 DIAGNOSIS — H0288A Meibomian gland dysfunction right eye, upper and lower eyelids: Secondary | ICD-10-CM | POA: Diagnosis not present

## 2021-04-29 DIAGNOSIS — H16223 Keratoconjunctivitis sicca, not specified as Sjogren's, bilateral: Secondary | ICD-10-CM | POA: Diagnosis not present

## 2021-04-29 DIAGNOSIS — H0288B Meibomian gland dysfunction left eye, upper and lower eyelids: Secondary | ICD-10-CM | POA: Diagnosis not present

## 2021-04-29 DIAGNOSIS — H5213 Myopia, bilateral: Secondary | ICD-10-CM | POA: Diagnosis not present

## 2021-06-06 ENCOUNTER — Ambulatory Visit
Admission: EM | Admit: 2021-06-06 | Discharge: 2021-06-06 | Disposition: A | Discharge status: Home / Self Care | Payer: Medicare Other | Attending: Physician Assistant | Admitting: Physician Assistant

## 2021-06-06 ENCOUNTER — Ambulatory Visit: Payer: Self-pay

## 2021-06-06 DIAGNOSIS — L309 Dermatitis, unspecified: Secondary | ICD-10-CM

## 2021-06-06 MED ORDER — KETOCONAZOLE 2 % EX CREA: 1.0000 "application " | TOPICAL_CREAM | Freq: Every day | CUTANEOUS | 0 refills | Status: AC

## 2021-06-06 NOTE — ED Provider Notes (Signed)
?[facility] ? ? ? ?CSN: [id number] ?Arrival date & time: 06/06/21  [id number] ? ? ?  ? ?History   ?Chief Complaint ?Chief Complaint  ?Patient presents with  ? Rash  ? ? ?HPI ?Catherine Arias is a 76 y.o. female.  ? ?Patient here today for evaluation of rash present to her ankles, legs, arms and chest.  She reports rash is itchy most of the time but she has had some burning as well.  She also notes some rash to her face.  She is not sure if rash is related to fungal infection of feet.  She does not report treatment for symptoms but does have a course of steroids that she did not take earlier.  She does not report fever.  She has not had any difficulty breathing and denies shortness of breath.  She denies any trouble swallowing and has no facial swelling. ? ?The history is provided by the patient.  ?Rash ?Associated symptoms: no abdominal pain, no fever, no nausea, no shortness of breath and not vomiting   ? ?Past Medical History:  ?Diagnosis Date  ? Arthritis   ? Back pain   ? Fibromyalgia   ? GERD (gastroesophageal reflux disease)   ? Hiatal hernia   ? High cholesterol   ? Hypertension   ? Joint pain   ? OAB (overactive bladder)   ? Pneumonia   ? PONV (postoperative nausea and vomiting)   ? ? ?Patient Active Problem List  ? Diagnosis Date Noted  ? Prediabetes 10/03/2019  ? Class 1 obesity due to excess calories with body mass index (BMI) of 30.0 to 30.9 in adult 10/02/2019  ? High blood pressure   ? Vaginal vault prolapse 02/01/2019  ? Recurrent UTI 12/28/2018  ? Hyperlipidemia 02/18/2018  ? Cystocele with prolapse 07/13/2017  ? HTN (hypertension) 09/25/2014  ? CONSTIPATION 05/27/2009  ? PERSONAL HX COLONIC POLYPS 05/27/2009  ? ? ?Past Surgical History:  ?Procedure Laterality Date  ? ABDOMINAL HYSTERECTOMY    ? ANTERIOR AND POSTERIOR REPAIR N/A 07/13/2017  ? Procedure: ANTERIOR (CYSTOCELE) AND POSTERIOR REPAIR (RECTOCELE);  Surgeon: Bjorn Loser, MD;  Location: [facility];  Service: Urology;  Laterality: N/A;   ? APPENDECTOMY    ? CYSTOSCOPY N/A 07/13/2017  ? Procedure: CYSTOSCOPY;  Surgeon: Bjorn Loser, MD;  Location: [facility];  Service: Urology;  Laterality: N/A;  ? INNER EAR SURGERY    ? 2014  ? repair cystocele redctocele vault prolapse graft cystoscopy    ? 07-13-17 Dr. Matilde Sprang  ? ? ?OB History   ? ? Gravida  ?2  ? Para  ?2  ? Term  ?   ? Preterm  ?   ? AB  ?   ? Living  ?   ?  ? ? SAB  ?   ? IAB  ?   ? Ectopic  ?   ? Multiple  ?   ? Live Births  ?   ?   ?  ?  ? ? ? ?Home Medications   ? ?Prior to Admission medications   ?Medication Sig Start Date End Date Taking? Authorizing Provider  ?ketoconazole (NIZORAL) 2 % cream Apply 1 application. topically daily. 06/06/21  Yes Francene Finders, PA-C  ?Ascorbic Acid (VITAMIN C) 100 MG tablet Take 100 mg by mouth daily.    [provider]  ?benzonatate (TESSALON) 100 MG capsule Take 1-2 capsules (100-200 mg total) by mouth 3 (three) times daily as needed. 04/03/20   Jaynee Eagles,  PA-C  ?estradiol (ESTRACE) 0.1 MG/GM vaginal cream Place 1 Applicatorful vaginally at bedtime.    [provider]  ?Fluocinolone Acetonide 0.01 % OIL Place 5 drops in ear(s) 2 (two) times daily. 01/03/21   Lynden Oxford Scales, PA-C  ?hydrochlorothiazide (MICROZIDE) 12.5 MG capsule Take 12.5 mg by mouth daily.    [provider]  ?losartan (COZAAR) 50 MG tablet Take 50 mg by mouth daily.     [provider]  ?omeprazole (PRILOSEC) 20 MG capsule Take 20 mg by mouth daily.    [provider]  ?Vitamin D, Ergocalciferol, (DRISDOL) 1.25 MG (50000 UNIT) CAPS capsule Take 1 capsule (50,000 Units total) by mouth every 7 (seven) days. 01/09/20   Georgia Lopes, DO  ? ? ?Family History ?Family History  ?Problem Relation Age of Onset  ? High blood pressure Mother   ? Diabetes Father   ? High Cholesterol Father   ? Heart disease Father   ? Sudden death Father   ? Breast cancer Cousin 85  ? ? ?Social History ?Social History  ? ?Tobacco Use  ? Smoking status: Never  ?  Smokeless tobacco: Never  ?Vaping Use  ? Vaping Use: Never used  ?Substance Use Topics  ? Alcohol use: No  ?  Alcohol/week: 0.0 standard drinks  ? Drug use: No  ? ? ? ?Allergies   ?Sulfa antibiotics, Amoxicillin, Sulfonamide derivatives, and Penicillins ? ? ?Review of Systems ?Review of Systems  ?Constitutional:  Negative for chills and fever.  ?HENT:  Negative for facial swelling and trouble swallowing.   ?Eyes:  Negative for discharge and redness.  ?Respiratory:  Negative for shortness of breath.   ?Gastrointestinal:  Negative for abdominal pain, nausea and vomiting.  ?Skin:  Positive for rash.  ? ? ?Physical Exam ?Triage Vital Signs ?ED Triage Vitals  ?Enc Vitals Group  ?   BP   ?   Pulse   ?   Resp   ?   Temp   ?   Temp src   ?   SpO2   ?   Weight   ?   Height   ?   Head Circumference   ?   Peak Flow   ?   Pain Score   ?   Pain Loc   ?   Pain Edu?   ?   Excl. in [place]?   ? ?No data found. ? ?Updated Vital Signs ?BP 134/78 (BP Location: Left Arm)   Pulse 65   Temp 98 ?F (36.7 ?C) (Oral)   Resp 18   SpO2 95%  ?   ? ?Physical Exam ?Vitals and nursing note reviewed.  ?Constitutional:   ?   General: She is not in acute distress. ?   Appearance: Normal appearance. She is not ill-appearing.  ?HENT:  ?   Head: Normocephalic and atraumatic.  ?Eyes:  ?   Conjunctiva/sclera: Conjunctivae normal.  ?Cardiovascular:  ?   Rate and Rhythm: Normal rate.  ?Pulmonary:  ?   Effort: Pulmonary effort is normal.  ?Skin: ?   Comments: Erythematous maculopapular lesions scattered sparsely to lower legs and ankles, forearms, mild erythema and dryness appreciated to maxillary area bilaterally  ?Neurological:  ?   Mental Status: She is alert.  ?Psychiatric:     ?   Mood and Affect: Mood normal.     ?   Behavior: Behavior normal.     ?   Thought Content: Thought content normal.  ? ? ? ?UC Treatments /  Results  ?Labs ?(all labs ordered are listed, but only abnormal results are displayed) ?Labs Reviewed - No data to  display ? ?EKG ? ? ?Radiology ?No results found. ? ?Procedures ?Procedures (including critical care time) ? ?Medications Ordered in UC ?Medications - No data to display ? ?Initial Impression / Assessment and Plan / UC Course  ?I have reviewed the triage vital signs and the nursing notes. ? ?Pertinent labs & imaging results that were available during my care of the patient were reviewed by me and considered in my medical decision making (see chart for details). ? ?  ?Will treat with topical antifungal (advised to avoid contact on face/ around eyes) as well as recommended she take steroid taper she has prescription for in office. (6 day course) Encouraged follow up if symptoms fail to improve or worsen in any way.  ? ?Final Clinical Impressions(s) / UC Diagnoses  ? ?Final diagnoses:  ?Dermatitis  ? ?Discharge Instructions   ?None ?  ? ?ED Prescriptions   ? ? Medication Sig Dispense Auth. Provider  ? ketoconazole (NIZORAL) 2 % cream Apply 1 application. topically daily. 15 g Francene Finders, PA-C  ? ?  ? ?PDMP not reviewed this encounter. ?  ?Francene Finders, PA-C ?06/06/21 1154 ? ?

## 2021-06-06 NOTE — ED Triage Notes (Signed)
Pt c/o itching rash to bilateral ankles, face, some on arms, and spots on chest.  ? ?Onset ~ 4 days ago  ?

## 2021-08-06 DIAGNOSIS — L820 Inflamed seborrheic keratosis: Secondary | ICD-10-CM | POA: Diagnosis not present

## 2021-08-06 DIAGNOSIS — S30860A Insect bite (nonvenomous) of lower back and pelvis, initial encounter: Secondary | ICD-10-CM | POA: Diagnosis not present

## 2021-08-06 IMAGING — MG MM DIGITAL SCREENING BILAT W/ TOMO AND CAD
8 series · 8 of 24 positions shown · non-contrast
Comparison: Previous exam(s).

CLINICAL DATA: Screening.

EXAM:
DIGITAL SCREENING BILATERAL MAMMOGRAM WITH TOMOSYNTHESIS AND CAD
TECHNIQUE: Bilateral screening digital craniocaudal and mediolateral oblique
mammograms were obtained. Bilateral screening digital breast
tomosynthesis was performed. The images were evaluated with
computer-aided detection.

[L MLO synth-2D]
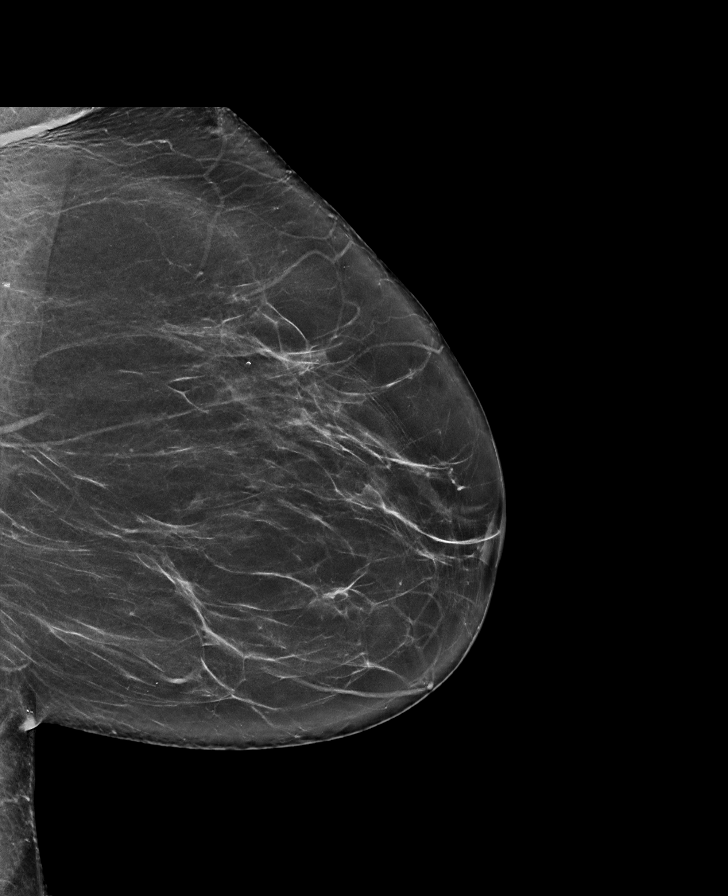

[R CC synth-2D]
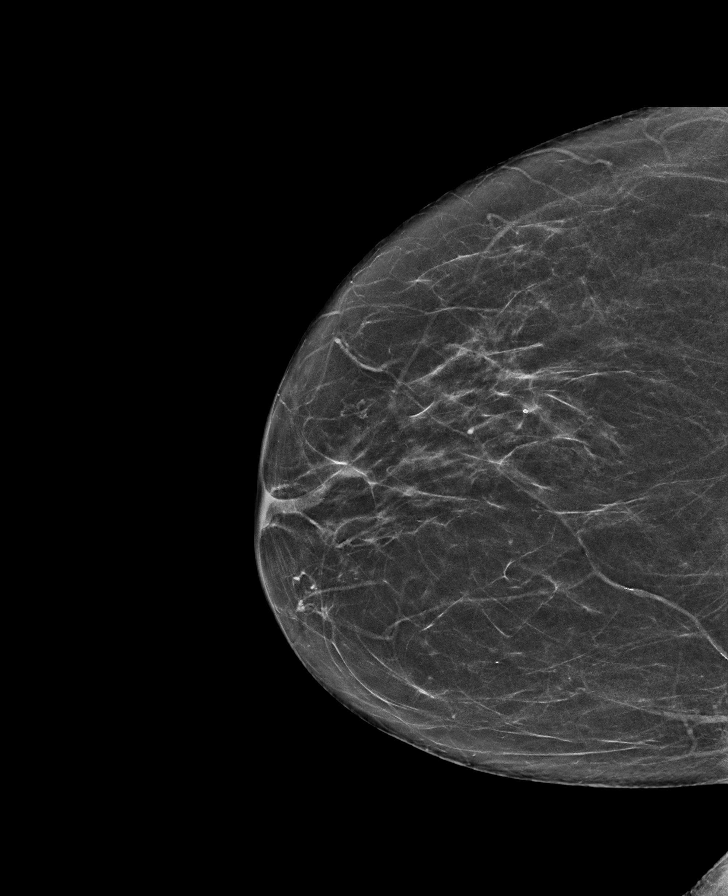

[R MLO synth-2D]
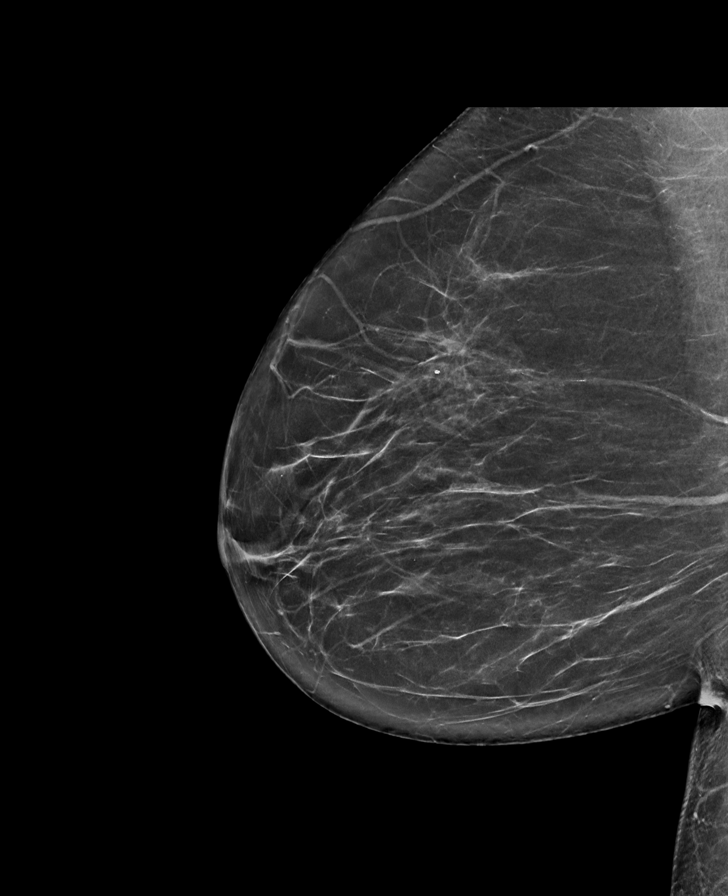

[L CC synth-2D]
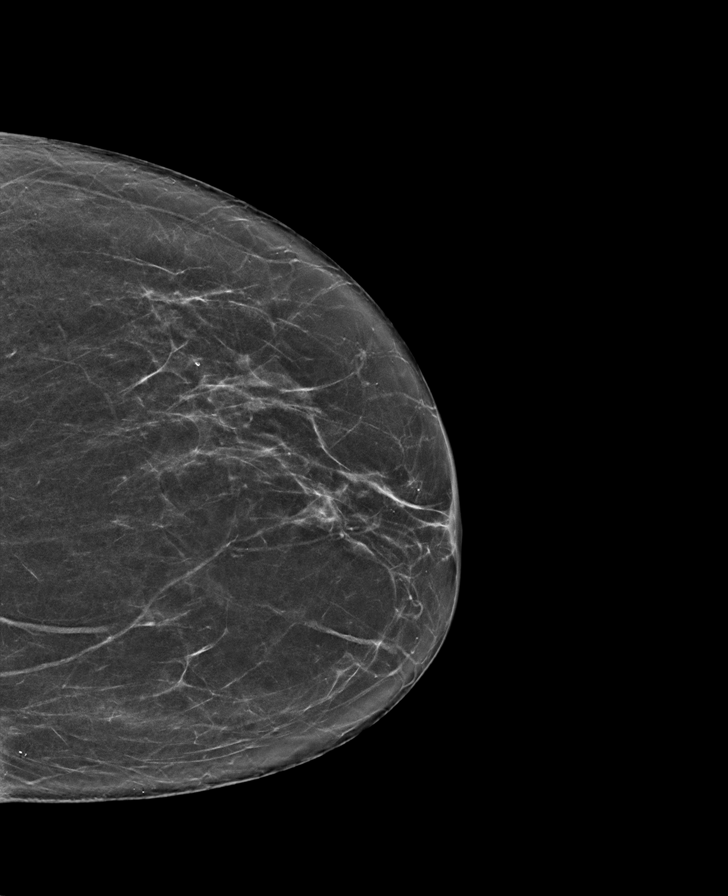

[L CC tomo · tomo slice 35/68.0]
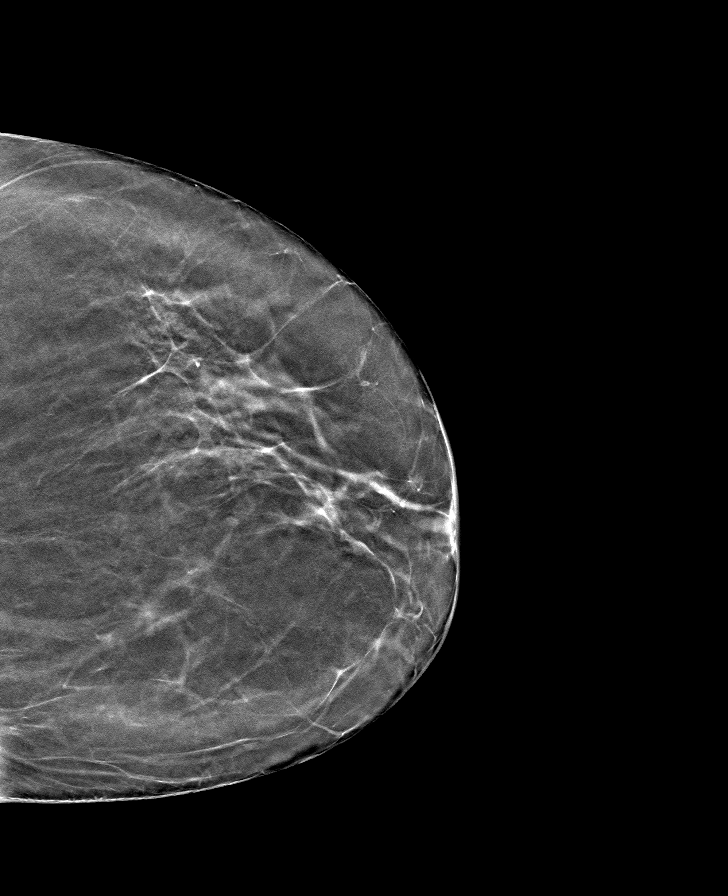

[R CC tomo · tomo slice 33/64.0]
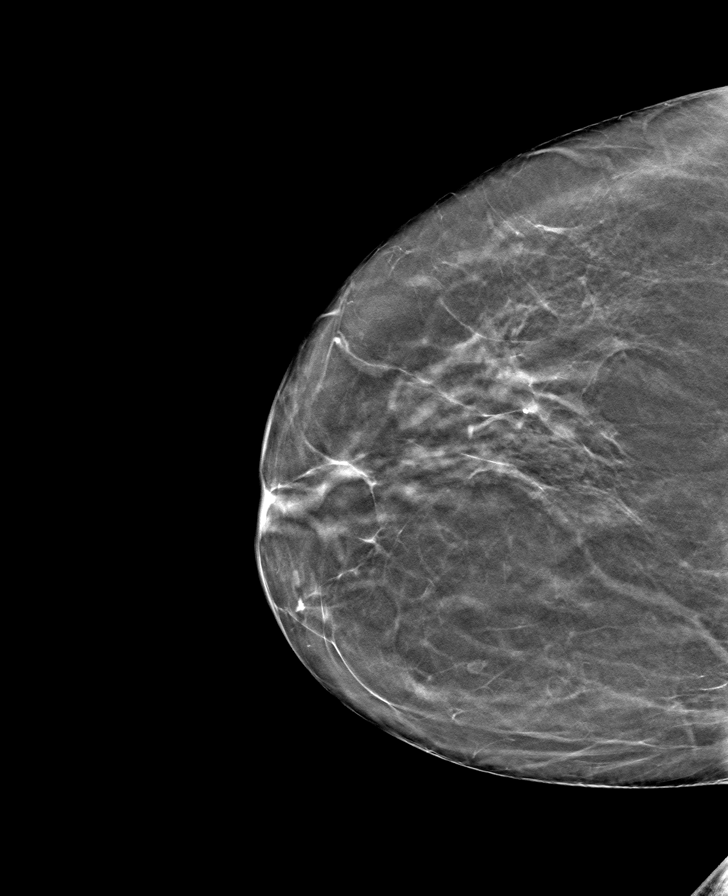

[L MLO tomo · tomo slice 39/77.0]
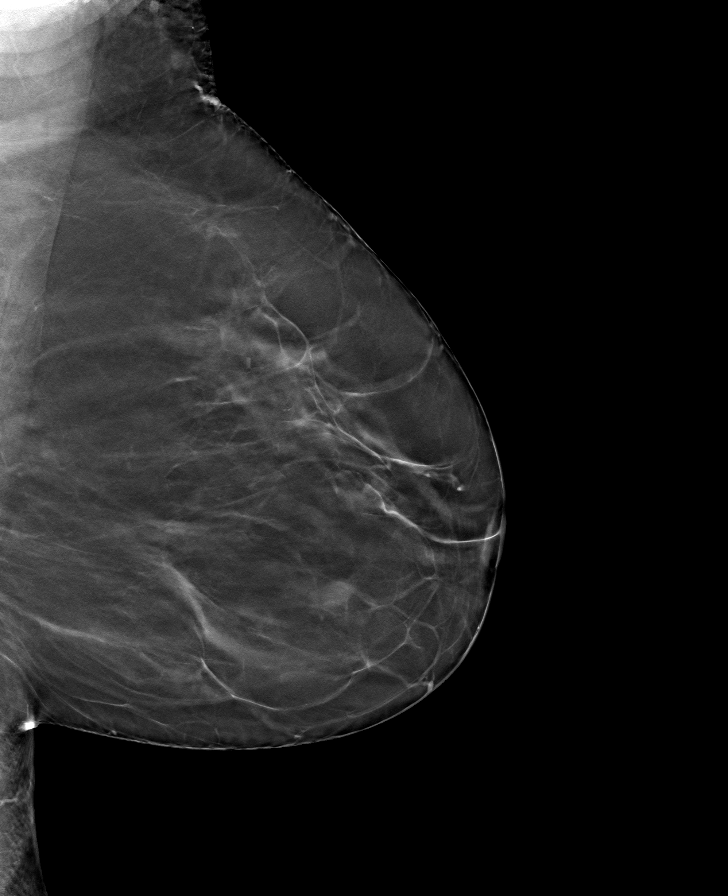

[R MLO tomo · tomo slice 37/72.0]
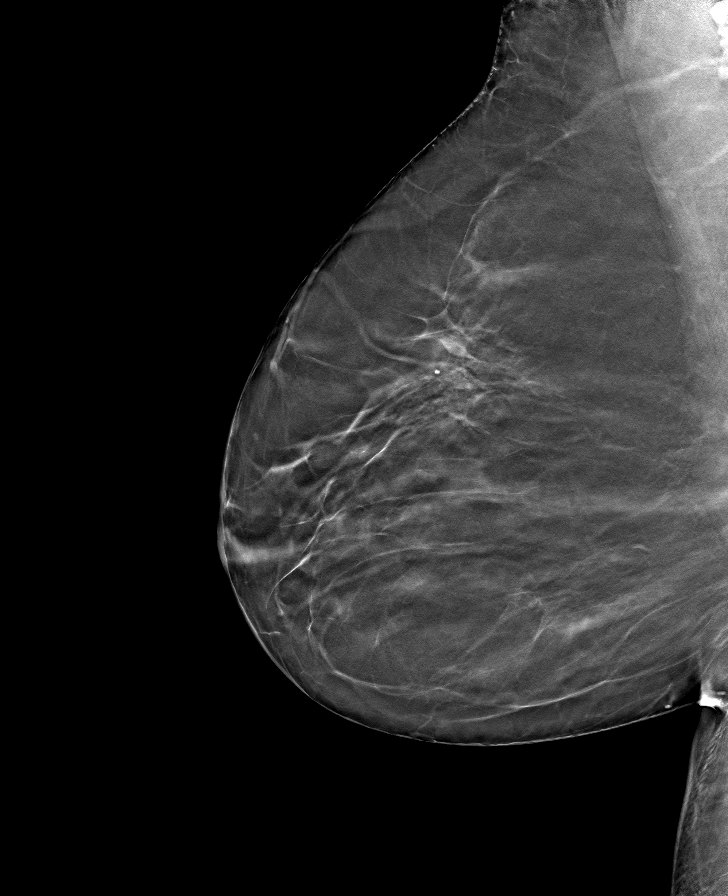

[8 of 24 positions shown; findings below may reference images not displayed]

ACR Breast Density Category b: There are scattered areas of
fibroglandular density.
FINDINGS: There are no findings suspicious for malignancy. The images were
evaluated with computer-aided detection.
IMPRESSION: No mammographic evidence of malignancy. A result letter of this
screening mammogram will be mailed directly to the patient.

RECOMMENDATION:
Screening mammogram in one year. (Code:[id number])

BI-RADS CATEGORY  1: Negative.

## 2021-09-08 ENCOUNTER — Other Ambulatory Visit: Payer: Self-pay | Admitting: Family Medicine

## 2021-09-08 DIAGNOSIS — K219 Gastro-esophageal reflux disease without esophagitis: Secondary | ICD-10-CM | POA: Diagnosis not present

## 2021-09-08 DIAGNOSIS — M85852 Other specified disorders of bone density and structure, left thigh: Secondary | ICD-10-CM

## 2021-09-08 DIAGNOSIS — E782 Mixed hyperlipidemia: Secondary | ICD-10-CM | POA: Diagnosis not present

## 2021-09-08 DIAGNOSIS — E559 Vitamin D deficiency, unspecified: Secondary | ICD-10-CM | POA: Diagnosis not present

## 2021-09-08 DIAGNOSIS — N3281 Overactive bladder: Secondary | ICD-10-CM | POA: Diagnosis not present

## 2021-09-08 DIAGNOSIS — Z87440 Personal history of urinary (tract) infections: Secondary | ICD-10-CM | POA: Diagnosis not present

## 2021-09-08 DIAGNOSIS — M797 Fibromyalgia: Secondary | ICD-10-CM | POA: Diagnosis not present

## 2021-09-08 DIAGNOSIS — N1832 Chronic kidney disease, stage 3b: Secondary | ICD-10-CM | POA: Diagnosis not present

## 2021-09-08 DIAGNOSIS — Z0000 Encounter for general adult medical examination without abnormal findings: Secondary | ICD-10-CM | POA: Diagnosis not present

## 2021-09-08 DIAGNOSIS — I10 Essential (primary) hypertension: Secondary | ICD-10-CM | POA: Diagnosis not present

## 2021-09-29 ENCOUNTER — Other Ambulatory Visit: Payer: Self-pay | Admitting: Family Medicine

## 2021-09-29 DIAGNOSIS — Z1231 Encounter for screening mammogram for malignant neoplasm of breast: Secondary | ICD-10-CM

## 2021-10-09 ENCOUNTER — Ambulatory Visit
Admission: RE | Admit: 2021-10-09 | Discharge: 2021-10-09 | Disposition: A | Discharge status: Home / Self Care | Payer: Medicare Other | Source: Ambulatory Visit | Attending: Family Medicine | Admitting: Family Medicine

## 2021-10-09 DIAGNOSIS — Z1231 Encounter for screening mammogram for malignant neoplasm of breast: Secondary | ICD-10-CM

## 2021-10-15 ENCOUNTER — Encounter (INDEPENDENT_AMBULATORY_CARE_PROVIDER_SITE_OTHER): Payer: Self-pay

## 2021-11-04 ENCOUNTER — Ambulatory Visit
Admission: RE | Admit: 2021-11-04 | Discharge: 2021-11-04 | Disposition: A | Discharge status: Home / Self Care | Payer: Medicare Other | Source: Ambulatory Visit | Attending: Physician Assistant | Admitting: Physician Assistant

## 2021-11-04 ENCOUNTER — Other Ambulatory Visit: Payer: Self-pay

## 2021-11-04 VITALS — BP 140/66 | HR 74 | Temp 98.1°F | Resp 18

## 2021-11-04 DIAGNOSIS — J0100 Acute maxillary sinusitis, unspecified: Secondary | ICD-10-CM | POA: Insufficient documentation

## 2021-11-04 DIAGNOSIS — Z20822 Contact with and (suspected) exposure to covid-19: Secondary | ICD-10-CM | POA: Insufficient documentation

## 2021-11-04 LAB — SARS CORONAVIRUS 2 (TAT 6-24 HRS): SARS Coronavirus 2: NEGATIVE

## 2021-11-04 MED ORDER — DOXYCYCLINE HYCLATE 100 MG PO CAPS: 100.0000 mg | ORAL_CAPSULE | Freq: Two times a day (BID) | ORAL | 0 refills | Status: DC

## 2021-11-04 NOTE — ED Provider Notes (Signed)
[name]    CSN: [id number] Arrival date & time: 11/04/21  1642      History   Chief Complaint Chief Complaint  Patient presents with   Cough    Entered by patient    HPI Catherine Arias is a 76 y.o. female.   Patient here today for evaluation of possible sinus infection. She reports maxillary sinus pressure that is worse when she leans forward with some associated ear pain and chills. She reports symptoms are similar to prior sinus infections. She denies any nausea, vomiting or diarrhea. She has taken multiple medications for treatment without resolution.   The history is provided by the patient.  Cough Associated symptoms: chills, ear pain and sore throat   Associated symptoms: no eye discharge, no fever, no shortness of breath and no wheezing     Past Medical History:  Diagnosis Date   Arthritis    Back pain    Fibromyalgia    GERD (gastroesophageal reflux disease)    Hiatal hernia    High cholesterol    Hypertension    Joint pain    OAB (overactive bladder)    Pneumonia    PONV (postoperative nausea and vomiting)     Patient Active Problem List   Diagnosis Date Noted   Prediabetes 10/03/2019   Class 1 obesity due to excess calories with body mass index (BMI) of 30.0 to 30.9 in adult 10/02/2019   High blood pressure    Vaginal vault prolapse 02/01/2019   Recurrent UTI 12/28/2018   Hyperlipidemia 02/18/2018   Cystocele with prolapse 07/13/2017   HTN (hypertension) 09/25/2014   CONSTIPATION 05/27/2009   PERSONAL HX COLONIC POLYPS 05/27/2009    Past Surgical History:  Procedure Laterality Date   ABDOMINAL HYSTERECTOMY     ANTERIOR AND POSTERIOR REPAIR N/A 07/13/2017   Procedure: ANTERIOR (CYSTOCELE) AND POSTERIOR REPAIR (RECTOCELE);  Surgeon: Alfredo Martinez, MD;  Location: [facility];  Service: Urology;  Laterality: N/A;   APPENDECTOMY     CYSTOSCOPY N/A 07/13/2017   Procedure: CYSTOSCOPY;  Surgeon: Alfredo Martinez, MD;  Location: [facility];   Service: Urology;  Laterality: N/A;   INNER EAR SURGERY     2014   repair cystocele redctocele vault prolapse graft cystoscopy     07-13-17 Dr. Sherron Monday    OB History     Gravida  2   Para  2   Term      Preterm      AB      Living         SAB      IAB      Ectopic      Multiple      Live Births               Home Medications    Prior to Admission medications   Medication Sig Start Date End Date Taking? Authorizing Provider  doxycycline (VIBRAMYCIN) 100 MG capsule Take 1 capsule (100 mg total) by mouth 2 (two) times daily. 11/04/21  Yes Tomi Bamberger, PA-C  Ascorbic Acid (VITAMIN C) 100 MG tablet Take 100 mg by mouth daily.    [provider]  benzonatate (TESSALON) 100 MG capsule Take 1-2 capsules (100-200 mg total) by mouth 3 (three) times daily as needed. 04/03/20   Wallis Bamberg, PA-C  estradiol (ESTRACE) 0.1 MG/GM vaginal cream Place 1 Applicatorful vaginally at bedtime.    [provider]  Fluocinolone Acetonide 0.01 % OIL Place 5 drops in ear(s)  2 (two) times daily. 01/03/21   Lynden Oxford Scales, PA-C  hydrochlorothiazide (MICROZIDE) 12.5 MG capsule Take 12.5 mg by mouth daily.    [provider]  ketoconazole (NIZORAL) 2 % cream Apply 1 application. topically daily. 06/06/21   Francene Finders, PA-C  losartan (COZAAR) 50 MG tablet Take 50 mg by mouth daily.     [provider]  omeprazole (PRILOSEC) 20 MG capsule Take 20 mg by mouth daily.    [provider]  Vitamin D, Ergocalciferol, (DRISDOL) 1.25 MG (50000 UNIT) CAPS capsule Take 1 capsule (50,000 Units total) by mouth every 7 (seven) days. 01/09/20   Georgia Lopes, DO    Family History Family History  Problem Relation Age of Onset   High blood pressure Mother    Diabetes Father    High Cholesterol Father    Heart disease Father    Sudden death Father    Breast cancer Cousin 41    Social History Social History   Tobacco Use   Smoking  status: Never   Smokeless tobacco: Never  Vaping Use   Vaping Use: Never used  Substance Use Topics   Alcohol use: No    Alcohol/week: 0.0 standard drinks of alcohol   Drug use: No     Allergies   Sulfa antibiotics, Amoxicillin, Sulfonamide derivatives, and Penicillins   Review of Systems Review of Systems  Constitutional:  Positive for chills. Negative for fever.  HENT:  Positive for congestion, ear pain, sinus pressure and sore throat.   Eyes:  Negative for discharge and redness.  Respiratory:  Negative for cough, shortness of breath and wheezing.   Gastrointestinal:  Negative for abdominal pain, diarrhea, nausea and vomiting.     Physical Exam Triage Vital Signs ED Triage Vitals [11/04/21 1708]  Enc Vitals Group     BP (!) 140/66     Pulse Rate 74     Resp 18     Temp 98.1 F (36.7 C)     Temp Source Oral     SpO2 94 %     Weight      Height      Head Circumference      Peak Flow      Pain Score 3     Pain Loc      Pain Edu?      Excl. in [place]?    No data found.  Updated Vital Signs BP (!) 140/66 (BP Location: Left Arm)   Pulse 74   Temp 98.1 F (36.7 C) (Oral)   Resp 18   SpO2 94%      Physical Exam Vitals and nursing note reviewed.  Constitutional:      General: She is not in acute distress.    Appearance: Normal appearance. She is not ill-appearing.  HENT:     Head: Normocephalic and atraumatic.     Right Ear: Tympanic membrane normal.     Left Ear: Tympanic membrane normal.     Nose: Congestion present.     Mouth/Throat:     Mouth: Mucous membranes are moist.     Pharynx: No oropharyngeal exudate or posterior oropharyngeal erythema.  Eyes:     Conjunctiva/sclera: Conjunctivae normal.  Cardiovascular:     Rate and Rhythm: Normal rate and regular rhythm.     Heart sounds: Normal heart sounds. No murmur heard. Pulmonary:     Effort: Pulmonary effort is normal. No respiratory distress.     Breath sounds: Normal breath sounds. No  wheezing,  rhonchi or rales.  Skin:    General: Skin is warm and dry.  Neurological:     Mental Status: She is alert.  Psychiatric:        Mood and Affect: Mood normal.        Thought Content: Thought content normal.      UC Treatments / Results  Labs (all labs ordered are listed, but only abnormal results are displayed) Labs Reviewed  SARS CORONAVIRUS 2 (TAT 6-24 HRS)    EKG   Radiology No results found.  Procedures Procedures (including critical care time)  Medications Ordered in UC Medications - No data to display  Initial Impression / Assessment and Plan / UC Course  I have reviewed the triage vital signs and the nursing notes.  Pertinent labs & imaging results that were available during my care of the patient were reviewed by me and considered in my medical decision making (see chart for details).    Will treat to cover sinusitis given reported pressure in maxillary sinuses and history of sinusitis but encouraged covid screening as well given recent spike in cases. Patient is agreeable to same. Will await results for further recommendation. Encouraged follow up with any concerns.   Final Clinical Impressions(s) / UC Diagnoses   Final diagnoses:  Acute maxillary sinusitis, recurrence not specified   Discharge Instructions   None    ED Prescriptions     Medication Sig Dispense Auth. Provider   doxycycline (VIBRAMYCIN) 100 MG capsule Take 1 capsule (100 mg total) by mouth 2 (two) times daily. 20 capsule Tomi Bamberger, PA-C      PDMP not reviewed this encounter.   Tomi Bamberger, PA-C 11/04/21 [phone]

## 2021-11-04 NOTE — ED Triage Notes (Signed)
Pt sts nasal congestion and ear pain x 2 days with chills

## 2021-11-24 ENCOUNTER — Ambulatory Visit
Admission: RE | Admit: 2021-11-24 | Discharge: 2021-11-24 | Disposition: A | Discharge status: Home / Self Care | Payer: Medicare Other | Source: Ambulatory Visit | Attending: Internal Medicine | Admitting: Internal Medicine

## 2021-11-24 VITALS — BP 147/77 | HR 75 | Temp 98.5°F | Resp 16

## 2021-11-24 DIAGNOSIS — N3001 Acute cystitis with hematuria: Secondary | ICD-10-CM | POA: Diagnosis not present

## 2021-11-24 DIAGNOSIS — R300 Dysuria: Secondary | ICD-10-CM | POA: Diagnosis not present

## 2021-11-24 LAB — POCT URINALYSIS DIP (MANUAL ENTRY)
Bilirubin, UA: NEGATIVE
Glucose, UA: NEGATIVE mg/dL
Ketones, POC UA: NEGATIVE mg/dL
Nitrite, UA: POSITIVE — AB
Protein Ur, POC: NEGATIVE mg/dL — AB
Spec Grav, UA: 1.025 (ref 1.010–1.025)
Urobilinogen, UA: 0.2 E.U./dL
pH, UA: 6 (ref 5.0–8.0)

## 2021-11-24 MED ORDER — CEPHALEXIN 500 MG PO CAPS: 500.0000 mg | ORAL_CAPSULE | Freq: Four times a day (QID) | ORAL | 0 refills | Status: DC

## 2021-11-24 NOTE — ED Provider Notes (Signed)
[name]    CSN: [id number] Arrival date & time: 11/24/21  1642      History   Chief Complaint Chief Complaint  Patient presents with   Dysuria    HPI Catherine Arias is a 76 y.o. female.   Patient presents with urinary burning and bilateral lower back pain that started yesterday.  Denies urinary frequency, urinary urgency, vaginal discharge, hematuria, abnormal vaginal bleeding, fever.  Patient reports history of recurrent urinary tract infections.  She is followed by urology.  Patient states that she takes [place] and has not had a urinary tract infection in approximately 1 year.   Dysuria   Past Medical History:  Diagnosis Date   Arthritis    Back pain    Fibromyalgia    GERD (gastroesophageal reflux disease)    Hiatal hernia    High cholesterol    Hypertension    Joint pain    OAB (overactive bladder)    Pneumonia    PONV (postoperative nausea and vomiting)     Patient Active Problem List   Diagnosis Date Noted   Prediabetes 10/03/2019   Class 1 obesity due to excess calories with body mass index (BMI) of 30.0 to 30.9 in adult 10/02/2019   High blood pressure    Vaginal vault prolapse 02/01/2019   Recurrent UTI 12/28/2018   Hyperlipidemia 02/18/2018   Cystocele with prolapse 07/13/2017   HTN (hypertension) 09/25/2014   CONSTIPATION 05/27/2009   PERSONAL HX COLONIC POLYPS 05/27/2009    Past Surgical History:  Procedure Laterality Date   ABDOMINAL HYSTERECTOMY     ANTERIOR AND POSTERIOR REPAIR N/A 07/13/2017   Procedure: ANTERIOR (CYSTOCELE) AND POSTERIOR REPAIR (RECTOCELE);  Surgeon: Bjorn Loser, MD;  Location: [facility];  Service: Urology;  Laterality: N/A;   APPENDECTOMY     CYSTOSCOPY N/A 07/13/2017   Procedure: CYSTOSCOPY;  Surgeon: Bjorn Loser, MD;  Location: [facility];  Service: Urology;  Laterality: N/A;   INNER EAR SURGERY     2014   repair cystocele redctocele vault prolapse graft cystoscopy     07-13-17 Dr. Matilde Sprang     OB History     Gravida  2   Para  2   Term      Preterm      AB      Living         SAB      IAB      Ectopic      Multiple      Live Births               Home Medications    Prior to Admission medications   Medication Sig Start Date End Date Taking? Authorizing Provider  cephALEXin (KEFLEX) 500 MG capsule Take 1 capsule (500 mg total) by mouth 4 (four) times daily. 11/24/21  Yes Akshith Moncus, Hildred Alamin E, FNP  Ascorbic Acid (VITAMIN C) 100 MG tablet Take 100 mg by mouth daily.    [provider]  benzonatate (TESSALON) 100 MG capsule Take 1-2 capsules (100-200 mg total) by mouth 3 (three) times daily as needed. 04/03/20   Jaynee Eagles, PA-C  doxycycline (VIBRAMYCIN) 100 MG capsule Take 1 capsule (100 mg total) by mouth 2 (two) times daily. 11/04/21   Francene Finders, PA-C  estradiol (ESTRACE) 0.1 MG/GM vaginal cream Place 1 Applicatorful vaginally at bedtime.    [provider]  Fluocinolone Acetonide 0.01 % OIL Place 5 drops in ear(s) 2 (two) times daily. 01/03/21   Lilia Pro,  Lindsay Scales, PA-C  hydrochlorothiazide (MICROZIDE) 12.5 MG capsule Take 12.5 mg by mouth daily.    [provider]  ketoconazole (NIZORAL) 2 % cream Apply 1 application. topically daily. 06/06/21   Tomi Bamberger, PA-C  losartan (COZAAR) 50 MG tablet Take 50 mg by mouth daily.     [provider]  omeprazole (PRILOSEC) 20 MG capsule Take 20 mg by mouth daily.    [provider]  Vitamin D, Ergocalciferol, (DRISDOL) 1.25 MG (50000 UNIT) CAPS capsule Take 1 capsule (50,000 Units total) by mouth every 7 (seven) days. 01/09/20   [place] Nickel, DO    Family History Family History  Problem Relation Age of Onset   High blood pressure Mother    Diabetes Father    High Cholesterol Father    Heart disease Father    Sudden death Father    Breast cancer Cousin 37    Social History Social History   Tobacco Use   Smoking status: Never   Smokeless  tobacco: Never  Vaping Use   Vaping Use: Never used  Substance Use Topics   Alcohol use: No    Alcohol/week: 0.0 standard drinks of alcohol   Drug use: No     Allergies   Sulfa antibiotics, Amoxicillin, Sulfonamide derivatives, and Penicillins   Review of Systems Review of Systems Per HPI  Physical Exam Triage Vital Signs ED Triage Vitals  Enc Vitals Group     BP 11/24/21 1716 (!) 147/77     Pulse Rate 11/24/21 1716 75     Resp 11/24/21 1716 16     Temp 11/24/21 1716 98.5 F (36.9 C)     Temp Source 11/24/21 1716 Oral     SpO2 11/24/21 1716 93 %     Weight --      Height --      Head Circumference --      Peak Flow --      Pain Score 11/24/21 1717 0     Pain Loc --      Pain Edu? --      Excl. in [place]? --    No data found.  Updated Vital Signs BP (!) 147/77 (BP Location: Left Arm)   Pulse 75   Temp 98.5 F (36.9 C) (Oral)   Resp 16   SpO2 93%   Visual Acuity Right Eye Distance:   Left Eye Distance:   Bilateral Distance:    Right Eye Near:   Left Eye Near:    Bilateral Near:     Physical Exam Constitutional:      General: She is not in acute distress.    Appearance: Normal appearance. She is not toxic-appearing or diaphoretic.  HENT:     Head: Normocephalic and atraumatic.  Eyes:     Extraocular Movements: Extraocular movements intact.     Conjunctiva/sclera: Conjunctivae normal.  Cardiovascular:     Rate and Rhythm: Normal rate and regular rhythm.     Pulses: Normal pulses.     Heart sounds: Normal heart sounds.  Pulmonary:     Effort: Pulmonary effort is normal. No respiratory distress.     Breath sounds: Normal breath sounds.  Abdominal:     General: Bowel sounds are normal. There is no distension.     Palpations: Abdomen is soft.     Tenderness: There is no abdominal tenderness.  Neurological:     General: No focal deficit present.     Mental Status: She is alert and oriented  to person, place, and time. Mental status is at baseline.   Psychiatric:        Mood and Affect: Mood normal.        Behavior: Behavior normal.        Thought Content: Thought content normal.        Judgment: Judgment normal.      UC Treatments / Results  Labs (all labs ordered are listed, but only abnormal results are displayed) Labs Reviewed  POCT URINALYSIS DIP (MANUAL ENTRY) - Abnormal; Notable for the following components:      Result Value   Color, UA yellow (*)    Clarity, UA cloudy (*)    Blood, UA trace-intact (*)    Protein Ur, POC negative (*)    Nitrite, UA Positive (*)    Leukocytes, UA Trace (*)    All other components within normal limits  URINE CULTURE    EKG   Radiology No results found.  Procedures Procedures (including critical care time)  Medications Ordered in UC Medications - No data to display  Initial Impression / Assessment and Plan / UC Course  I have reviewed the triage vital signs and the nursing notes.  Pertinent labs & imaging results that were available during my care of the patient were reviewed by me and considered in my medical decision making (see chart for details).     Urinalysis indicating urinary tract infection.  Will treat with cephalexin.  Last creatinine clearance appears normal so no dosage adjustment necessary.  Urine culture pending.  Patient advised to follow-up if symptoms persist or worsen.  Patient verbalized understanding and was agreeable with plan. Final Clinical Impressions(s) / UC Diagnoses   Final diagnoses:  Acute cystitis with hematuria  Dysuria     Discharge Instructions      You have been prescribed antibiotic for urinary tract infection.  Urine culture is pending.  We will call if it is positive.  Please follow-up if symptoms persist or worsen.    ED Prescriptions     Medication Sig Dispense Auth. Provider   cephALEXin (KEFLEX) 500 MG capsule Take 1 capsule (500 mg total) by mouth 4 (four) times daily. 28 capsule [place], Acie Fredrickson, [place]      PDMP not  reviewed this encounter.   Gustavus Bryant, [place] 11/24/21 1756

## 2021-11-24 NOTE — Discharge Instructions (Signed)
You have been prescribed antibiotic for urinary tract infection.  Urine culture is pending.  We will call if it is positive.  Please follow-up if symptoms persist or worsen.

## 2021-11-24 NOTE — ED Triage Notes (Signed)
Pt c/o dysuria, new back pain, onset ~ yesterday. Denies pain during triage.

## 2021-11-26 LAB — URINE CULTURE: Culture: >=100000 — AB

## 2022-02-11 DIAGNOSIS — N390 Urinary tract infection, site not specified: Secondary | ICD-10-CM | POA: Diagnosis not present

## 2022-02-19 DIAGNOSIS — N390 Urinary tract infection, site not specified: Secondary | ICD-10-CM | POA: Diagnosis not present

## 2022-02-19 DIAGNOSIS — I10 Essential (primary) hypertension: Secondary | ICD-10-CM | POA: Diagnosis not present

## 2022-03-03 ENCOUNTER — Inpatient Hospital Stay: Admission: RE | Admit: 2022-03-03 | Payer: Self-pay | Source: Ambulatory Visit

## 2022-03-03 ENCOUNTER — Encounter: Payer: Self-pay | Admitting: Emergency Medicine

## 2022-03-03 ENCOUNTER — Ambulatory Visit
Admission: EM | Admit: 2022-03-03 | Discharge: 2022-03-03 | Disposition: A | Discharge status: Home / Self Care | Payer: Medicare Other | Attending: Family Medicine | Admitting: Family Medicine

## 2022-03-03 DIAGNOSIS — N3090 Cystitis, unspecified without hematuria: Secondary | ICD-10-CM | POA: Insufficient documentation

## 2022-03-03 LAB — POCT URINALYSIS DIP (MANUAL ENTRY)
Bilirubin, UA: NEGATIVE
Blood, UA: NEGATIVE
Glucose, UA: NEGATIVE mg/dL
Ketones, POC UA: NEGATIVE mg/dL
Leukocytes, UA: NEGATIVE
Nitrite, UA: POSITIVE — AB
Protein Ur, POC: NEGATIVE mg/dL
Spec Grav, UA: 1.025 (ref 1.010–1.025)
Urobilinogen, UA: 0.2 E.U./dL
pH, UA: 5.5 (ref 5.0–8.0)

## 2022-03-03 MED ORDER — CEPHALEXIN 500 MG PO CAPS: 500.0000 mg | ORAL_CAPSULE | Freq: Two times a day (BID) | ORAL | 0 refills | Status: DC

## 2022-03-03 NOTE — ED Provider Notes (Signed)
[facility]    ASSESSMENT & PLAN:  1. Cystitis    Begin: Meds ordered this encounter  Medications   cephALEXin (KEFLEX) 500 MG capsule    Sig: Take 1 capsule (500 mg total) by mouth 2 (two) times daily.    Dispense:  10 capsule    Refill:  0   No signs of pyelonephritis. Discussed. Urine culture sent. Will notify patient of any significant results. Will follow up with her PCP or here if not showing improvement over the next 48 hours, sooner if needed.  Outlined signs and symptoms indicating need for more acute intervention. Patient verbalized understanding. After Visit Summary given.  SUBJECTIVE:  Catherine Arias is a 76 y.o. female who complains of urinary frequency, urgency and dysuria for the past 3 days. Without associated flank pain, fever, chills, vaginal discharge or bleeding. Gross hematuria: not present. No specific aggravating or alleviating factors reported. No LE edema. Normal PO intake without n/v/d. Without specific abdominal pain. Ambulatory without difficulty. OTC treatment: AZO with some relief.  LMP: No LMP recorded. Patient is postmenopausal.  OBJECTIVE:  Vitals:   03/03/22 1259  BP: (!) 154/83  Pulse: 67  Resp: 18  Temp: (!) 97.1 F (36.2 C)  SpO2: 93%   General appearance: alert; no distress HENT: oropharynx: moist Lungs: unlabored respirations Abdomen: soft, non-tender; bowel sounds normal; no masses or organomegaly; no guarding or rebound tenderness Back: no CVA tenderness Extremities: no edema; symmetrical with no gross deformities Skin: warm and dry Neurologic: normal gait Psychological: alert and cooperative; normal mood and affect  Labs Reviewed  POCT URINALYSIS DIP (MANUAL ENTRY) - Abnormal; Notable for the following components:      Result Value   Color, UA orange (*)    Nitrite, UA Positive (*)    All other components within normal limits  URINE CULTURE    Allergies  Allergen Reactions   Sulfa Antibiotics Hives    Amoxicillin Hives and Other (See Comments)    Has patient had a PCN reaction causing immediate rash, facial/tongue/throat swelling, SOB or lightheadedness with hypotension: Yes Has patient had a PCN reaction causing severe rash involving mucus membranes or skin necrosis: No Has patient had a PCN reaction that required hospitalization: No Has patient had a PCN reaction occurring within the last 10 years: No If all of the above answers are "NO", then may proceed with Cephalosporin use.    Sulfonamide Derivatives Hives   Penicillins Rash    Pt states can take Keflex    Past Medical History:  Diagnosis Date   Arthritis    Back pain    Fibromyalgia    GERD (gastroesophageal reflux disease)    Hiatal hernia    High cholesterol    Hypertension    Joint pain    OAB (overactive bladder)    Pneumonia    PONV (postoperative nausea and vomiting)    Social History   Socioeconomic History   Marital status: Divorced    Spouse name: Not on file   Number of children: Not on file   Years of education: Not on file   Highest education level: Not on file  Occupational History   Occupation: retired  Tobacco Use   Smoking status: Never   Smokeless tobacco: Never  Vaping Use   Vaping Use: Never used  Substance and Sexual Activity   Alcohol use: No    Alcohol/week: 0.0 standard drinks of alcohol   Drug use: No   Sexual activity: Yes  Other Topics Concern   Not on file  Social History Narrative   Not on file   Social Determinants of Health   Financial Resource Strain: Not on file  Food Insecurity: Not on file  Transportation Needs: Not on file  Physical Activity: Not on file  Stress: Not on file  Social Connections: Not on file  Intimate Partner Violence: Not on file   Family History  Problem Relation Age of Onset   High blood pressure Mother    Diabetes Father    High Cholesterol Father    Heart disease Father    Sudden death Father    Breast cancer Cousin 2         Mardella Layman, MD 03/03/22 1630

## 2022-03-03 NOTE — ED Triage Notes (Signed)
Pt is present today with c/o dysuria. Pt states that she has been taking OTC AZO   Onset~x3 days ago

## 2022-03-03 NOTE — Discharge Instructions (Signed)
You have had labs (urine culture) sent today. We will call you with any significant abnormalities or if there is need to begin or change treatment or pursue further follow up.  You may also review your test results online through MyChart. If you do not have a MyChart account, instructions to sign up should be on your discharge paperwork.  

## 2022-03-05 LAB — URINE CULTURE: Culture: >=100000 — AB

## 2022-03-10 ENCOUNTER — Ambulatory Visit: Payer: Self-pay

## 2022-03-10 DIAGNOSIS — N390 Urinary tract infection, site not specified: Secondary | ICD-10-CM | POA: Diagnosis not present

## 2022-03-13 ENCOUNTER — Other Ambulatory Visit: Payer: Medicare Other

## 2022-03-17 DIAGNOSIS — N3281 Overactive bladder: Secondary | ICD-10-CM | POA: Diagnosis not present

## 2022-03-17 DIAGNOSIS — N281 Cyst of kidney, acquired: Secondary | ICD-10-CM | POA: Diagnosis not present

## 2022-03-17 DIAGNOSIS — N390 Urinary tract infection, site not specified: Secondary | ICD-10-CM | POA: Diagnosis not present

## 2022-04-02 DIAGNOSIS — N3281 Overactive bladder: Secondary | ICD-10-CM | POA: Diagnosis not present

## 2022-04-02 DIAGNOSIS — N390 Urinary tract infection, site not specified: Secondary | ICD-10-CM | POA: Diagnosis not present

## 2022-04-07 DIAGNOSIS — E782 Mixed hyperlipidemia: Secondary | ICD-10-CM | POA: Diagnosis not present

## 2022-04-07 DIAGNOSIS — E559 Vitamin D deficiency, unspecified: Secondary | ICD-10-CM | POA: Diagnosis not present

## 2022-04-07 DIAGNOSIS — Z87440 Personal history of urinary (tract) infections: Secondary | ICD-10-CM | POA: Diagnosis not present

## 2022-04-07 DIAGNOSIS — G720 Drug-induced myopathy: Secondary | ICD-10-CM | POA: Diagnosis not present

## 2022-04-07 DIAGNOSIS — M797 Fibromyalgia: Secondary | ICD-10-CM | POA: Diagnosis not present

## 2022-04-07 DIAGNOSIS — M85852 Other specified disorders of bone density and structure, left thigh: Secondary | ICD-10-CM | POA: Diagnosis not present

## 2022-04-07 DIAGNOSIS — K219 Gastro-esophageal reflux disease without esophagitis: Secondary | ICD-10-CM | POA: Diagnosis not present

## 2022-04-07 DIAGNOSIS — I10 Essential (primary) hypertension: Secondary | ICD-10-CM | POA: Diagnosis not present

## 2022-04-07 DIAGNOSIS — N3281 Overactive bladder: Secondary | ICD-10-CM | POA: Diagnosis not present

## 2022-04-21 DIAGNOSIS — N390 Urinary tract infection, site not specified: Secondary | ICD-10-CM | POA: Diagnosis not present

## 2022-06-04 DIAGNOSIS — K5900 Constipation, unspecified: Secondary | ICD-10-CM | POA: Diagnosis not present

## 2022-06-04 DIAGNOSIS — N390 Urinary tract infection, site not specified: Secondary | ICD-10-CM | POA: Diagnosis not present

## 2022-06-22 DIAGNOSIS — R070 Pain in throat: Secondary | ICD-10-CM | POA: Diagnosis not present

## 2022-06-22 DIAGNOSIS — R059 Cough, unspecified: Secondary | ICD-10-CM | POA: Diagnosis not present

## 2022-06-22 DIAGNOSIS — J209 Acute bronchitis, unspecified: Secondary | ICD-10-CM | POA: Diagnosis not present

## 2022-06-22 DIAGNOSIS — Z20822 Contact with and (suspected) exposure to covid-19: Secondary | ICD-10-CM | POA: Diagnosis not present

## 2022-06-22 DIAGNOSIS — R062 Wheezing: Secondary | ICD-10-CM | POA: Diagnosis not present

## 2022-08-21 ENCOUNTER — Ambulatory Visit
Admission: RE | Admit: 2022-08-21 | Discharge: 2022-08-21 | Disposition: A | Discharge status: Home / Self Care | Payer: Medicare Other | Source: Ambulatory Visit | Attending: Family Medicine | Admitting: Family Medicine

## 2022-08-21 DIAGNOSIS — M85852 Other specified disorders of bone density and structure, left thigh: Secondary | ICD-10-CM

## 2022-08-21 DIAGNOSIS — E349 Endocrine disorder, unspecified: Secondary | ICD-10-CM | POA: Diagnosis not present

## 2022-10-13 ENCOUNTER — Ambulatory Visit (INDEPENDENT_AMBULATORY_CARE_PROVIDER_SITE_OTHER): Payer: Medicare Other

## 2022-10-13 ENCOUNTER — Ambulatory Visit
Admission: RE | Admit: 2022-10-13 | Discharge: 2022-10-13 | Disposition: A | Discharge status: Home / Self Care | Payer: Medicare Other | Source: Ambulatory Visit | Attending: Internal Medicine | Admitting: Internal Medicine

## 2022-10-13 VITALS — BP 152/69 | HR 82 | Temp 98.8°F | Resp 16 | Ht 61.0 in | Wt 155.0 lb

## 2022-10-13 DIAGNOSIS — R053 Chronic cough: Secondary | ICD-10-CM | POA: Insufficient documentation

## 2022-10-13 DIAGNOSIS — J029 Acute pharyngitis, unspecified: Secondary | ICD-10-CM | POA: Insufficient documentation

## 2022-10-13 DIAGNOSIS — J069 Acute upper respiratory infection, unspecified: Secondary | ICD-10-CM | POA: Diagnosis not present

## 2022-10-13 DIAGNOSIS — R059 Cough, unspecified: Secondary | ICD-10-CM | POA: Diagnosis not present

## 2022-10-13 DIAGNOSIS — R350 Frequency of micturition: Secondary | ICD-10-CM | POA: Insufficient documentation

## 2022-10-13 DIAGNOSIS — R300 Dysuria: Secondary | ICD-10-CM | POA: Insufficient documentation

## 2022-10-13 DIAGNOSIS — N3001 Acute cystitis with hematuria: Secondary | ICD-10-CM | POA: Diagnosis not present

## 2022-10-13 LAB — POCT URINALYSIS DIP (MANUAL ENTRY)
Bilirubin, UA: NEGATIVE
Glucose, UA: NEGATIVE mg/dL
Ketones, POC UA: NEGATIVE mg/dL
Nitrite, UA: POSITIVE — AB
Spec Grav, UA: 1.02 (ref 1.010–1.025)
Urobilinogen, UA: 0.2 E.U./dL
pH, UA: 5.5 (ref 5.0–8.0)

## 2022-10-13 LAB — POCT RAPID STREP A (OFFICE): Rapid Strep A Screen: NEGATIVE

## 2022-10-13 MED ORDER — CEFDINIR 300 MG PO CAPS: 300.0000 mg | ORAL_CAPSULE | Freq: Two times a day (BID) | ORAL | 0 refills | Status: AC

## 2022-10-13 MED ORDER — CEFDINIR 300 MG PO CAPS: 300.0000 mg | ORAL_CAPSULE | Freq: Two times a day (BID) | ORAL | 0 refills | Status: DC

## 2022-10-13 NOTE — ED Provider Notes (Addendum)
[name]    CSN: [id number] Arrival date & time: 10/13/22  1808      History   Chief Complaint Chief Complaint  Patient presents with   Sore Throat    HPI Catherine Arias is a 77 y.o. female.   Patient presents today with several different chief complaints.  Patient reports sore throat, cough, weakness, chills, nasal congestion, nasal drip that started about 1 to 2 weeks ago.  Denies chest pain or shortness of breath.  Reports that she had low-grade temp at start of symptoms which is now resolved.  Denies any known sick contacts.  Patient is taking Mucinex and Tylenol with minimal improvement of symptoms.  Denies history of asthma or COPD and patient does not smoke cigarettes.  Patient also reporting some urinary frequency, dysuria, lower bladder pressure that started around the same time.  Reports that she previously had frequent UTIs but her urologist put her on Uqora and she has not had a UTI in quite some time.  Patient denies abdominal pain, flank pain, fever, vaginal discharge, hematuria.   Sore Throat    Past Medical History:  Diagnosis Date   Arthritis    Back pain    Fibromyalgia    GERD (gastroesophageal reflux disease)    Hiatal hernia    High cholesterol    Hypertension    Joint pain    OAB (overactive bladder)    Pneumonia    PONV (postoperative nausea and vomiting)     Patient Active Problem List   Diagnosis Date Noted   Prediabetes 10/03/2019   Class 1 obesity due to excess calories with body mass index (BMI) of 30.0 to 30.9 in adult 10/02/2019   High blood pressure    Vaginal vault prolapse 02/01/2019   Recurrent UTI 12/28/2018   Hyperlipidemia 02/18/2018   Cystocele with prolapse 07/13/2017   HTN (hypertension) 09/25/2014   CONSTIPATION 05/27/2009   PERSONAL HX COLONIC POLYPS 05/27/2009    Past Surgical History:  Procedure Laterality Date   ABDOMINAL HYSTERECTOMY     ANTERIOR AND POSTERIOR REPAIR N/A 07/13/2017   Procedure:  ANTERIOR (CYSTOCELE) AND POSTERIOR REPAIR (RECTOCELE);  Surgeon: Alfredo Martinez, MD;  Location: [facility];  Service: Urology;  Laterality: N/A;   APPENDECTOMY     CYSTOSCOPY N/A 07/13/2017   Procedure: CYSTOSCOPY;  Surgeon: Alfredo Martinez, MD;  Location: [facility];  Service: Urology;  Laterality: N/A;   INNER EAR SURGERY     2014   repair cystocele redctocele vault prolapse graft cystoscopy     07-13-17 Dr. Sherron Monday    OB History     Gravida  2   Para  2   Term      Preterm      AB      Living         SAB      IAB      Ectopic      Multiple      Live Births               Home Medications    Prior to Admission medications   Medication Sig Start Date End Date Taking? Authorizing Provider  Ascorbic Acid (VITAMIN C) 100 MG tablet Take 100 mg by mouth daily.    [provider]  benzonatate (TESSALON) 100 MG capsule Take 1-2 capsules (100-200 mg total) by mouth 3 (three) times daily as needed. 04/03/20   Wallis Bamberg, PA-C  cefdinir (OMNICEF) 300 MG capsule Take 1 capsule (  300 mg total) by mouth 2 (two) times daily for 10 days. 10/13/22 10/23/22  Gustavus Bryant, FNP  estradiol (ESTRACE) 0.1 MG/GM vaginal cream Place 1 Applicatorful vaginally at bedtime.    [provider]  Fluocinolone Acetonide 0.01 % OIL Place 5 drops in ear(s) 2 (two) times daily. 01/03/21   Theadora Rama Scales, PA-C  hydrochlorothiazide (MICROZIDE) 12.5 MG capsule Take 12.5 mg by mouth daily.    [provider]  ketoconazole (NIZORAL) 2 % cream Apply 1 application. topically daily. 06/06/21   Tomi Bamberger, PA-C  losartan (COZAAR) 50 MG tablet Take 50 mg by mouth daily.     [provider]  omeprazole (PRILOSEC) 20 MG capsule Take 20 mg by mouth daily.    [provider]  Vitamin D, Ergocalciferol, (DRISDOL) 1.25 MG (50000 UNIT) CAPS capsule Take 1 capsule (50,000 Units total) by mouth every 7 (seven) days. 01/09/20   [place] Nickel, DO    Family  History Family History  Problem Relation Age of Onset   High blood pressure Mother    Diabetes Father    High Cholesterol Father    Heart disease Father    Sudden death Father    Breast cancer Cousin 50    Social History Social History   Tobacco Use   Smoking status: Never   Smokeless tobacco: Never  Vaping Use   Vaping status: Never Used  Substance Use Topics   Alcohol use: No    Alcohol/week: 0.0 standard drinks of alcohol   Drug use: No     Allergies   Sulfa antibiotics, Amoxicillin, Sulfonamide derivatives, and Penicillins   Review of Systems Review of Systems per HPI  Physical Exam Triage Vital Signs ED Triage Vitals  Encounter Vitals Group     BP 10/13/22 1827 (!) 152/69     Systolic BP Percentile --      Diastolic BP Percentile --      Pulse Rate 10/13/22 1827 82     Resp 10/13/22 1827 16     Temp 10/13/22 1827 98.8 F (37.1 C)     Temp Source 10/13/22 1827 Oral     SpO2 10/13/22 1827 94 %     Weight 10/13/22 1826 155 lb (70.3 kg)     Height 10/13/22 1826 5\' 1"  (1.549 m)     Head Circumference --      Peak Flow --      Pain Score 10/13/22 1826 5     Pain Loc --      Pain Education --      Exclude from Growth Chart --    No data found.  Updated Vital Signs BP (!) 152/69 (BP Location: Left Arm)   Pulse 82   Temp 98.8 F (37.1 C) (Oral)   Resp 16   Ht 5\' 1"  (1.549 m)   Wt 155 lb (70.3 kg)   SpO2 97%   BMI 29.29 kg/m   Visual Acuity Right Eye Distance:   Left Eye Distance:   Bilateral Distance:    Right Eye Near:   Left Eye Near:    Bilateral Near:     Physical Exam Constitutional:      General: She is not in acute distress.    Appearance: Normal appearance. She is not toxic-appearing or diaphoretic.  HENT:     Head: Normocephalic and atraumatic.     Right Ear: Tympanic membrane and ear canal normal.     Left Ear: Tympanic membrane and ear canal  normal.     Nose: Congestion present.     Mouth/Throat:     Mouth: Mucous  membranes are moist.     Pharynx: Posterior oropharyngeal erythema present.  Eyes:     Extraocular Movements: Extraocular movements intact.     Conjunctiva/sclera: Conjunctivae normal.     Pupils: Pupils are equal, round, and reactive to light.  Cardiovascular:     Rate and Rhythm: Normal rate and regular rhythm.     Pulses: Normal pulses.     Heart sounds: Normal heart sounds.  Pulmonary:     Effort: Pulmonary effort is normal. No respiratory distress.     Breath sounds: Normal breath sounds. No stridor. No wheezing, rhonchi or rales.  Abdominal:     General: Abdomen is flat. Bowel sounds are normal.     Palpations: Abdomen is soft.  Musculoskeletal:        General: Normal range of motion.     Cervical back: Normal range of motion.  Skin:    General: Skin is warm and dry.  Neurological:     General: No focal deficit present.     Mental Status: She is alert and oriented to person, place, and time. Mental status is at baseline.  Psychiatric:        Mood and Affect: Mood normal.        Behavior: Behavior normal.      UC Treatments / Results  Labs (all labs ordered are listed, but only abnormal results are displayed) Labs Reviewed  POCT URINALYSIS DIP (MANUAL ENTRY) - Abnormal; Notable for the following components:      Result Value   Color, UA straw (*)    Clarity, UA cloudy (*)    Blood, UA trace-intact (*)    Protein Ur, POC trace (*)    Nitrite, UA Positive (*)    Leukocytes, UA Large (3+) (*)    All other components within normal limits  URINE CULTURE  CULTURE, GROUP A STREP [facility])  POCT RAPID STREP A (OFFICE)    EKG   Radiology DG Chest 2 View  Result Date: 10/13/2022 CLINICAL DATA:  Cough EXAM: CHEST - 2 VIEW COMPARISON:  05/22/2020 FINDINGS: Stable linear scarring within the right middle lobe. Lungs are otherwise clear. No pneumothorax or pleural effusion. Cardiac size within normal limits. Pulmonary vascularity is normal. No acute bone abnormality.  IMPRESSION: 1. No active cardiopulmonary disease. Electronically Signed   By: Helyn Numbers M.D.   On: 10/13/2022 19:13    Procedures Procedures (including critical care time)  Medications Ordered in UC Medications - No data to display  Initial Impression / Assessment and Plan / UC Course  I have reviewed the triage vital signs and the nursing notes.  Pertinent labs & imaging results that were available during my care of the patient were reviewed by me and considered in my medical decision making (see chart for details).     1.  Acute upper respiratory infection  Chest x-ray completed that was negative for any acute cardiopulmonary process.  Suspect sinus infection.  Will treat with cefdinir antibiotic.  Rapid strep is negative.  Throat culture pending.  Viral testing deferred given duration of symptoms as it would not change treatment.  Patient is allergic to penicillins but has taken cephalosporins previously and tolerated well.  Creatinine clearance is 58 so no dosage adjustment necessary.  2.  Acute cystitis  It appears the patient has a urinary tract infection.  Cefdinir should treat this as well.  Urine culture pending.  Advised to follow-up if any symptoms persist or worsen.  Patient verbalized understanding and was agreeable with plan.  Patient's cefdinir prescription was originally sent to [facility] as this was patient's original pharmacy pick.  Then, patient realized that pharmacy was closed so it was [facility] to [facility] on [address]. Although[facility] reports that [facility] not [facility] given [facility].  Therefore, called [facility] to discontinue cefdinir and patient reports she will pick up the medication tomorrow when [facility] opens. Final Clinical Impressions(s) / UC Diagnoses   Final diagnoses:  Acute cystitis with hematuria  Dysuria  Urinary frequency  Sore throat  Persistent cough  Acute upper respiratory infection     Discharge Instructions      I  have prescribed you an antibiotic for UTI and upper respiratory infection.       ED Prescriptions     Medication Sig Dispense Auth. Provider   cefdinir (OMNICEF) 300 MG capsule  (Status: Discontinued) Take 1 capsule (300 mg total) by mouth 2 (two) times daily for 10 days. 20 capsule [place], [place], [place]   cefdinir (OMNICEF) 300 MG capsule Take 1 capsule (300 mg total) by mouth 2 (two) times daily for 10 days. 20 capsule Gustavus Bryant, [place]      PDMP not reviewed this encounter.   Gustavus Bryant, [place] 10/13/22 1924    Gustavus Bryant, [place] 10/13/22 [phone]

## 2022-10-13 NOTE — ED Triage Notes (Signed)
Patient here today with c/o ST, cough, chills, PND, and weakness for over a week now. Patient is concerned with Covid. She has been taking Mucinex and Tylenol Sinus with temporary relief. No sick contacts. No recent travel.   She is also having some urinary frequency, burning in urination, and LB pain X 2 weeks.

## 2022-10-13 NOTE — Discharge Instructions (Signed)
I have prescribed you an antibiotic for UTI and upper respiratory infection.

## 2022-10-27 DIAGNOSIS — H0288A Meibomian gland dysfunction right eye, upper and lower eyelids: Secondary | ICD-10-CM | POA: Diagnosis not present

## 2022-10-27 DIAGNOSIS — H0288B Meibomian gland dysfunction left eye, upper and lower eyelids: Secondary | ICD-10-CM | POA: Diagnosis not present

## 2022-10-29 DIAGNOSIS — Z23 Encounter for immunization: Secondary | ICD-10-CM | POA: Diagnosis not present

## 2022-10-29 DIAGNOSIS — I10 Essential (primary) hypertension: Secondary | ICD-10-CM | POA: Diagnosis not present

## 2022-10-29 DIAGNOSIS — E782 Mixed hyperlipidemia: Secondary | ICD-10-CM | POA: Diagnosis not present

## 2022-10-29 DIAGNOSIS — M85852 Other specified disorders of bone density and structure, left thigh: Secondary | ICD-10-CM | POA: Diagnosis not present

## 2022-10-29 DIAGNOSIS — N1832 Chronic kidney disease, stage 3b: Secondary | ICD-10-CM | POA: Diagnosis not present

## 2022-10-29 DIAGNOSIS — E559 Vitamin D deficiency, unspecified: Secondary | ICD-10-CM | POA: Diagnosis not present

## 2022-10-29 DIAGNOSIS — M797 Fibromyalgia: Secondary | ICD-10-CM | POA: Diagnosis not present

## 2022-10-29 DIAGNOSIS — Z0000 Encounter for general adult medical examination without abnormal findings: Secondary | ICD-10-CM | POA: Diagnosis not present

## 2022-10-29 DIAGNOSIS — N3281 Overactive bladder: Secondary | ICD-10-CM | POA: Diagnosis not present

## 2022-10-29 DIAGNOSIS — K219 Gastro-esophageal reflux disease without esophagitis: Secondary | ICD-10-CM | POA: Diagnosis not present

## 2022-10-30 DIAGNOSIS — R946 Abnormal results of thyroid function studies: Secondary | ICD-10-CM | POA: Diagnosis not present

## 2022-11-02 DIAGNOSIS — K2289 Other specified disease of esophagus: Secondary | ICD-10-CM | POA: Diagnosis not present

## 2022-11-02 DIAGNOSIS — K222 Esophageal obstruction: Secondary | ICD-10-CM | POA: Diagnosis not present

## 2022-11-02 DIAGNOSIS — Q399 Congenital malformation of esophagus, unspecified: Secondary | ICD-10-CM | POA: Diagnosis not present

## 2022-11-02 DIAGNOSIS — R1310 Dysphagia, unspecified: Secondary | ICD-10-CM | POA: Diagnosis not present

## 2022-11-02 DIAGNOSIS — K317 Polyp of stomach and duodenum: Secondary | ICD-10-CM | POA: Diagnosis not present

## 2022-11-02 DIAGNOSIS — K3189 Other diseases of stomach and duodenum: Secondary | ICD-10-CM | POA: Diagnosis not present

## 2022-11-02 DIAGNOSIS — K449 Diaphragmatic hernia without obstruction or gangrene: Secondary | ICD-10-CM | POA: Diagnosis not present

## 2022-11-02 DIAGNOSIS — K319 Disease of stomach and duodenum, unspecified: Secondary | ICD-10-CM | POA: Diagnosis not present

## 2022-11-04 DIAGNOSIS — K319 Disease of stomach and duodenum, unspecified: Secondary | ICD-10-CM | POA: Diagnosis not present

## 2022-11-04 DIAGNOSIS — K2289 Other specified disease of esophagus: Secondary | ICD-10-CM | POA: Diagnosis not present

## 2022-11-23 DIAGNOSIS — E0590 Thyrotoxicosis, unspecified without thyrotoxic crisis or storm: Secondary | ICD-10-CM | POA: Diagnosis not present

## 2022-11-23 DIAGNOSIS — D649 Anemia, unspecified: Secondary | ICD-10-CM | POA: Diagnosis not present

## 2022-11-26 DIAGNOSIS — E0590 Thyrotoxicosis, unspecified without thyrotoxic crisis or storm: Secondary | ICD-10-CM | POA: Diagnosis not present

## 2022-12-03 ENCOUNTER — Other Ambulatory Visit: Payer: Self-pay | Admitting: Family Medicine

## 2022-12-03 DIAGNOSIS — Z1231 Encounter for screening mammogram for malignant neoplasm of breast: Secondary | ICD-10-CM

## 2022-12-22 DIAGNOSIS — E0590 Thyrotoxicosis, unspecified without thyrotoxic crisis or storm: Secondary | ICD-10-CM | POA: Diagnosis not present

## 2022-12-23 ENCOUNTER — Ambulatory Visit
Admission: RE | Admit: 2022-12-23 | Discharge: 2022-12-23 | Disposition: A | Discharge status: Home / Self Care | Payer: Medicare Other | Source: Ambulatory Visit | Attending: Family Medicine | Admitting: Family Medicine

## 2022-12-23 DIAGNOSIS — Z1231 Encounter for screening mammogram for malignant neoplasm of breast: Secondary | ICD-10-CM

## 2022-12-28 DIAGNOSIS — E061 Subacute thyroiditis: Secondary | ICD-10-CM | POA: Diagnosis not present

## 2022-12-28 DIAGNOSIS — D649 Anemia, unspecified: Secondary | ICD-10-CM | POA: Diagnosis not present

## 2023-01-07 DIAGNOSIS — N390 Urinary tract infection, site not specified: Secondary | ICD-10-CM | POA: Diagnosis not present

## 2023-01-26 DIAGNOSIS — E061 Subacute thyroiditis: Secondary | ICD-10-CM | POA: Diagnosis not present

## 2023-02-15 DIAGNOSIS — N390 Urinary tract infection, site not specified: Secondary | ICD-10-CM | POA: Diagnosis not present

## 2023-03-23 DIAGNOSIS — K5730 Diverticulosis of large intestine without perforation or abscess without bleeding: Secondary | ICD-10-CM | POA: Diagnosis not present

## 2023-03-23 DIAGNOSIS — Z860100 Personal history of colon polyps, unspecified: Secondary | ICD-10-CM | POA: Diagnosis not present

## 2023-05-04 DIAGNOSIS — N3281 Overactive bladder: Secondary | ICD-10-CM | POA: Diagnosis not present

## 2023-05-04 DIAGNOSIS — E782 Mixed hyperlipidemia: Secondary | ICD-10-CM | POA: Diagnosis not present

## 2023-05-04 DIAGNOSIS — E559 Vitamin D deficiency, unspecified: Secondary | ICD-10-CM | POA: Diagnosis not present

## 2023-05-04 DIAGNOSIS — I10 Essential (primary) hypertension: Secondary | ICD-10-CM | POA: Diagnosis not present

## 2023-05-04 DIAGNOSIS — K219 Gastro-esophageal reflux disease without esophagitis: Secondary | ICD-10-CM | POA: Diagnosis not present

## 2023-05-04 DIAGNOSIS — Z87440 Personal history of urinary (tract) infections: Secondary | ICD-10-CM | POA: Diagnosis not present

## 2023-05-04 DIAGNOSIS — M797 Fibromyalgia: Secondary | ICD-10-CM | POA: Diagnosis not present

## 2023-05-04 DIAGNOSIS — M85852 Other specified disorders of bone density and structure, left thigh: Secondary | ICD-10-CM | POA: Diagnosis not present

## 2023-05-04 DIAGNOSIS — N1832 Chronic kidney disease, stage 3b: Secondary | ICD-10-CM | POA: Diagnosis not present

## 2023-05-04 DIAGNOSIS — D649 Anemia, unspecified: Secondary | ICD-10-CM | POA: Diagnosis not present

## 2023-06-28 DIAGNOSIS — R946 Abnormal results of thyroid function studies: Secondary | ICD-10-CM | POA: Diagnosis not present

## 2023-06-28 DIAGNOSIS — R7989 Other specified abnormal findings of blood chemistry: Secondary | ICD-10-CM | POA: Diagnosis not present

## 2023-07-05 DIAGNOSIS — E061 Subacute thyroiditis: Secondary | ICD-10-CM | POA: Diagnosis not present

## 2023-07-05 DIAGNOSIS — E038 Other specified hypothyroidism: Secondary | ICD-10-CM | POA: Diagnosis not present

## 2023-10-07 DIAGNOSIS — I10 Essential (primary) hypertension: Secondary | ICD-10-CM | POA: Diagnosis not present

## 2023-10-07 DIAGNOSIS — N1832 Chronic kidney disease, stage 3b: Secondary | ICD-10-CM | POA: Diagnosis not present

## 2023-10-07 DIAGNOSIS — E782 Mixed hyperlipidemia: Secondary | ICD-10-CM | POA: Diagnosis not present

## 2023-10-18 DIAGNOSIS — M79672 Pain in left foot: Secondary | ICD-10-CM | POA: Diagnosis not present

## 2023-10-18 DIAGNOSIS — M79671 Pain in right foot: Secondary | ICD-10-CM | POA: Diagnosis not present

## 2023-10-25 ENCOUNTER — Encounter: Payer: Self-pay | Admitting: Podiatry

## 2023-10-25 ENCOUNTER — Ambulatory Visit: Admitting: Podiatry

## 2023-10-25 ENCOUNTER — Ambulatory Visit (INDEPENDENT_AMBULATORY_CARE_PROVIDER_SITE_OTHER)

## 2023-10-25 VITALS — Ht 61.0 in | Wt 155.0 lb

## 2023-10-25 DIAGNOSIS — M7751 Other enthesopathy of right foot: Secondary | ICD-10-CM

## 2023-10-25 DIAGNOSIS — M7752 Other enthesopathy of left foot: Secondary | ICD-10-CM | POA: Diagnosis not present

## 2023-10-25 DIAGNOSIS — M65972 Unspecified synovitis and tenosynovitis, left ankle and foot: Secondary | ICD-10-CM

## 2023-10-25 MED ORDER — BETAMETHASONE SOD PHOS & ACET 6 (3-3) MG/ML IJ SUSP
3.0000 mg | Freq: Once | INTRAMUSCULAR | Status: AC
  Administered 2023-10-25: 3 mg via INTRA_ARTICULAR

## 2023-10-25 NOTE — Progress Notes (Signed)
 Chief Complaint  Patient presents with   Foot Pain    Pt is here due to bilateral foot pain, she states the pain has been there for about 6 months gradually getting worse, states her great toes are starting to cross over the second toes.    HPI: 78 y.o. female presenting today as a new patient for evaluation of left forefoot pain.  She is concerned because her mother had bunions which progressively became very symptomatic and painful in her later years.  She has been developing some left foot pain and would like to be evaluated  Past Medical History:  Diagnosis Date   Arthritis    Back pain    Fibromyalgia    GERD (gastroesophageal reflux disease)    Hiatal hernia    High cholesterol    Hypertension    Joint pain    OAB (overactive bladder)    Pneumonia    PONV (postoperative nausea and vomiting)     Past Surgical History:  Procedure Laterality Date   ABDOMINAL HYSTERECTOMY     ANTERIOR AND POSTERIOR REPAIR N/A 07/13/2017   Procedure: ANTERIOR (CYSTOCELE) AND POSTERIOR REPAIR (RECTOCELE);  Surgeon: Gaston Hamilton, MD;  Location: [facility];  Service: Urology;  Laterality: N/A;   APPENDECTOMY     CYSTOSCOPY N/A 07/13/2017   Procedure: CYSTOSCOPY;  Surgeon: Gaston Hamilton, MD;  Location: [facility];  Service: Urology;  Laterality: N/A;   INNER EAR SURGERY     2014   repair cystocele redctocele vault prolapse graft cystoscopy     07-13-17 Dr. Gaston    Allergies  Allergen Reactions   Sulfa Antibiotics Hives   Amoxicillin Hives and Other (See Comments)    Has patient had a PCN reaction causing immediate rash, facial/tongue/throat swelling, SOB or lightheadedness with hypotension: Yes Has patient had a PCN reaction causing severe rash involving mucus membranes or skin necrosis: No Has patient had a PCN reaction that required hospitalization: No Has patient had a PCN reaction occurring within the last 10 years: No If all of the above answers are NO, then may proceed with  Cephalosporin use.    Sulfonamide Derivatives Hives   Penicillins Rash    Pt states can take Keflex      Physical Exam: General: The patient is alert and oriented x3 in no acute distress.  Dermatology: Skin is warm, dry and supple bilateral lower extremities.   Vascular: Palpable pedal pulses bilaterally. Capillary refill within normal limits.  No appreciable edema.  No erythema.  Neurological: Grossly intact via light touch  Musculoskeletal Exam: No pedal deformities noted.  Tenderness with palpation and range of motion of the second MTP of the left foot  Radiographic Exam B/L foot 10/25/2023:  Normal osseous mineralization. Joint spaces preserved.  No fractures or osseous irregularities noted.  Impression: Negative  Assessment/Plan of Care: 1.  Second MTP capsulitis/synovitis left  -Patient evaluated.  X-rays reviewed -Injection of 0.5 cc Celestone  Soluspan injected around the second MTP left foot -Patient admits to walking around the house barefoot hardwood floors the majority of the day.  Discontinue.  Recommend good supportive shoes and sneakers to support the arch of the foot and offload pressure from the forefoot -Return to clinic PRN       Thresa EMERSON Sar, DPM [facility]  Dr. Thresa EMERSON Sar, DPM    [address]  [place], [place] [address]                Office [phone]  Fax [phone]

## 2023-11-04 DIAGNOSIS — H0288A Meibomian gland dysfunction right eye, upper and lower eyelids: Secondary | ICD-10-CM | POA: Diagnosis not present

## 2023-11-04 DIAGNOSIS — H0288B Meibomian gland dysfunction left eye, upper and lower eyelids: Secondary | ICD-10-CM | POA: Diagnosis not present

## 2023-11-04 DIAGNOSIS — H16223 Keratoconjunctivitis sicca, not specified as Sjogren's, bilateral: Secondary | ICD-10-CM | POA: Diagnosis not present

## 2023-11-04 DIAGNOSIS — H26491 Other secondary cataract, right eye: Secondary | ICD-10-CM | POA: Diagnosis not present

## 2023-11-07 DIAGNOSIS — N1832 Chronic kidney disease, stage 3b: Secondary | ICD-10-CM | POA: Diagnosis not present

## 2023-11-07 DIAGNOSIS — E782 Mixed hyperlipidemia: Secondary | ICD-10-CM | POA: Diagnosis not present

## 2023-11-07 DIAGNOSIS — I10 Essential (primary) hypertension: Secondary | ICD-10-CM | POA: Diagnosis not present

## 2023-12-07 DIAGNOSIS — E782 Mixed hyperlipidemia: Secondary | ICD-10-CM | POA: Diagnosis not present

## 2023-12-07 DIAGNOSIS — N1832 Chronic kidney disease, stage 3b: Secondary | ICD-10-CM | POA: Diagnosis not present

## 2023-12-07 DIAGNOSIS — I10 Essential (primary) hypertension: Secondary | ICD-10-CM | POA: Diagnosis not present

## 2023-12-13 DIAGNOSIS — E782 Mixed hyperlipidemia: Secondary | ICD-10-CM | POA: Diagnosis not present

## 2023-12-13 DIAGNOSIS — M85852 Other specified disorders of bone density and structure, left thigh: Secondary | ICD-10-CM | POA: Diagnosis not present

## 2023-12-13 DIAGNOSIS — I10 Essential (primary) hypertension: Secondary | ICD-10-CM | POA: Diagnosis not present

## 2023-12-13 DIAGNOSIS — N3281 Overactive bladder: Secondary | ICD-10-CM | POA: Diagnosis not present

## 2023-12-13 DIAGNOSIS — E038 Other specified hypothyroidism: Secondary | ICD-10-CM | POA: Diagnosis not present

## 2023-12-13 DIAGNOSIS — N1832 Chronic kidney disease, stage 3b: Secondary | ICD-10-CM | POA: Diagnosis not present

## 2023-12-13 DIAGNOSIS — E559 Vitamin D deficiency, unspecified: Secondary | ICD-10-CM | POA: Diagnosis not present

## 2023-12-13 DIAGNOSIS — Z0000 Encounter for general adult medical examination without abnormal findings: Secondary | ICD-10-CM | POA: Diagnosis not present

## 2023-12-29 ENCOUNTER — Other Ambulatory Visit: Payer: Self-pay | Admitting: Family Medicine

## 2023-12-29 DIAGNOSIS — Z1231 Encounter for screening mammogram for malignant neoplasm of breast: Secondary | ICD-10-CM

## 2024-01-18 ENCOUNTER — Ambulatory Visit
Admission: RE | Admit: 2024-01-18 | Discharge: 2024-01-18 | Disposition: A | Source: Ambulatory Visit | Attending: Family Medicine | Admitting: Family Medicine

## 2024-01-18 DIAGNOSIS — Z1231 Encounter for screening mammogram for malignant neoplasm of breast: Secondary | ICD-10-CM

## 2024-02-24 ENCOUNTER — Ambulatory Visit
